# Patient Record
Sex: Female | Born: 1990 | Race: Black or African American | Hispanic: No | Marital: Single | Smoking: Former smoker
Health system: Southern US, Community
[De-identification: ages and names within clinical notes are randomized; demographics above are authoritative.]

## PROBLEM LIST (undated history)

## (undated) DIAGNOSIS — F509 Eating disorder, unspecified: Secondary | ICD-10-CM

## (undated) DIAGNOSIS — F329 Major depressive disorder, single episode, unspecified: Secondary | ICD-10-CM

## (undated) DIAGNOSIS — F32A Depression, unspecified: Secondary | ICD-10-CM

## (undated) DIAGNOSIS — R55 Syncope and collapse: Secondary | ICD-10-CM

## (undated) HISTORY — DX: Eating disorder, unspecified: F50.9

## (undated) HISTORY — DX: Depression, unspecified: F32.A

## (undated) HISTORY — DX: Major depressive disorder, single episode, unspecified: F32.9

## (undated) HISTORY — DX: Syncope and collapse: R55

---
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
## 2010-03-12 ENCOUNTER — Ambulatory Visit
Admission: RE | Admit: 2010-03-12 | Discharge: 2010-03-12 | Payer: Self-pay | Source: Home / Self Care | Admitting: Otolaryngology

## 2010-11-17 LAB — POCT HEMOGLOBIN-HEMACUE
Hemoglobin: 10 g/dL — ABNORMAL LOW (ref 12.0–15.0)
Hemoglobin: 9.3 g/dL — ABNORMAL LOW (ref 12.0–15.0)
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
## 2011-07-20 ENCOUNTER — Emergency Department: Payer: Self-pay | Admitting: Internal Medicine

## 2011-08-06 ENCOUNTER — Emergency Department: Payer: Self-pay

## 2012-11-11 ENCOUNTER — Emergency Department: Payer: Self-pay | Admitting: Emergency Medicine

## 2012-11-12 LAB — COMPREHENSIVE METABOLIC PANEL
Albumin: 3.6 g/dL (ref 3.4–5.0)
Alkaline Phosphatase: 41 U/L — ABNORMAL LOW (ref 50–136)
Anion Gap: 8 (ref 7–16)
BUN: 9 mg/dL (ref 7–18)
Bilirubin,Total: 0.5 mg/dL (ref 0.2–1.0)
Calcium, Total: 8.7 mg/dL (ref 8.5–10.1)
Chloride: 106 mmol/L (ref 98–107)
Co2: 21 mmol/L (ref 21–32)
Creatinine: 0.57 mg/dL — ABNORMAL LOW (ref 0.60–1.30)
EGFR (African American): >60
EGFR (Non-African Amer.): >60
Glucose: 77 mg/dL (ref 65–99)
Osmolality: 268 (ref 275–301)
Potassium: 3.8 mmol/L (ref 3.5–5.1)
SGOT(AST): 21 U/L (ref 15–37)
SGPT (ALT): 18 U/L (ref 12–78)
Sodium: 135 mmol/L — ABNORMAL LOW (ref 136–145)
Total Protein: 7.3 g/dL (ref 6.4–8.2)

## 2012-11-12 LAB — URINALYSIS, COMPLETE
Bilirubin,UR: NEGATIVE
Glucose,UR: NEGATIVE mg/dL (ref 0–75)
Ketone: NEGATIVE
Leukocyte Esterase: NEGATIVE
Nitrite: POSITIVE
Ph: 7 (ref 4.5–8.0)
Protein: NEGATIVE
RBC,UR: 78 /HPF (ref 0–5)
Specific Gravity: 1.019 (ref 1.003–1.030)
Squamous Epithelial: <1
WBC UR: 1 /HPF (ref 0–5)

## 2012-11-12 LAB — CBC WITH DIFFERENTIAL/PLATELET
Basophil #: 0 10*3/uL (ref 0.0–0.1)
Basophil %: 0.7 %
Eosinophil #: 0.1 10*3/uL (ref 0.0–0.7)
Eosinophil %: 1.1 %
HCT: 26 % — ABNORMAL LOW (ref 35.0–47.0)
HGB: 7.7 g/dL — ABNORMAL LOW (ref 12.0–16.0)
Lymphocyte #: 2.5 10*3/uL (ref 1.0–3.6)
Lymphocyte %: 46 %
MCH: 23.2 pg — ABNORMAL LOW (ref 26.0–34.0)
MCHC: 29.6 g/dL — ABNORMAL LOW (ref 32.0–36.0)
MCV: 79 fL — ABNORMAL LOW (ref 80–100)
Monocyte #: 0.6 x10 3/mm (ref 0.2–0.9)
Monocyte %: 10.4 %
Neutrophil #: 2.3 10*3/uL (ref 1.4–6.5)
Neutrophil %: 41.8 %
Platelet: 263 10*3/uL (ref 150–440)
RBC: 3.31 10*6/uL — ABNORMAL LOW (ref 3.80–5.20)
RDW: 20 % — ABNORMAL HIGH (ref 11.5–14.5)
WBC: 5.4 10*3/uL (ref 3.6–11.0)

## 2012-11-12 LAB — PREGNANCY, URINE: Pregnancy Test, Urine: NEGATIVE m[IU]/mL

## 2012-11-12 LAB — CK: CK, Total: 177 U/L (ref 21–215)

## 2013-01-12 ENCOUNTER — Emergency Department: Payer: Self-pay | Admitting: Internal Medicine

## 2013-01-12 LAB — COMPREHENSIVE METABOLIC PANEL
Albumin: 4.2 g/dL (ref 3.4–5.0)
Alkaline Phosphatase: 39 U/L — ABNORMAL LOW (ref 50–136)
Anion Gap: 10 (ref 7–16)
BUN: 14 mg/dL (ref 7–18)
Bilirubin,Total: 0.9 mg/dL (ref 0.2–1.0)
Calcium, Total: 9.2 mg/dL (ref 8.5–10.1)
Chloride: 106 mmol/L (ref 98–107)
Co2: 21 mmol/L (ref 21–32)
Creatinine: 0.59 mg/dL — ABNORMAL LOW (ref 0.60–1.30)
EGFR (African American): >60
EGFR (Non-African Amer.): >60
Glucose: 103 mg/dL — ABNORMAL HIGH (ref 65–99)
Osmolality: 275 (ref 275–301)
Potassium: 3.5 mmol/L (ref 3.5–5.1)
SGOT(AST): 26 U/L (ref 15–37)
SGPT (ALT): 19 U/L (ref 12–78)
Sodium: 137 mmol/L (ref 136–145)
Total Protein: 8 g/dL (ref 6.4–8.2)

## 2013-01-12 LAB — CBC
HCT: 28 % — ABNORMAL LOW (ref 35.0–47.0)
HGB: 8.6 g/dL — ABNORMAL LOW (ref 12.0–16.0)
MCH: 23.3 pg — ABNORMAL LOW (ref 26.0–34.0)
MCHC: 30.7 g/dL — ABNORMAL LOW (ref 32.0–36.0)
MCV: 76 fL — ABNORMAL LOW (ref 80–100)
Platelet: 376 10*3/uL (ref 150–440)
RBC: 3.69 10*6/uL — ABNORMAL LOW (ref 3.80–5.20)
RDW: 21.5 % — ABNORMAL HIGH (ref 11.5–14.5)
WBC: 4.2 10*3/uL (ref 3.6–11.0)

## 2013-01-12 LAB — URINALYSIS, COMPLETE
Bilirubin,UR: NEGATIVE
Glucose,UR: NEGATIVE mg/dL (ref 0–75)
Leukocyte Esterase: NEGATIVE
Nitrite: NEGATIVE
Ph: 5 (ref 4.5–8.0)
Protein: 30
RBC,UR: 323 /HPF (ref 0–5)
Specific Gravity: 1.03 (ref 1.003–1.030)
Squamous Epithelial: 3
WBC UR: 3 /HPF (ref 0–5)

## 2013-01-12 LAB — FERRITIN: Ferritin (ARMC): 4 ng/mL — ABNORMAL LOW (ref 8–388)

## 2013-01-12 LAB — LACTATE DEHYDROGENASE: LDH: 158 U/L (ref 81–246)

## 2013-01-12 LAB — IRON: Iron: 24 ug/dL — ABNORMAL LOW (ref 50–170)

## 2013-01-12 LAB — RETICULOCYTES
Absolute Retic Count: 0.0558 10*6/uL
Reticulocyte: 1.52 %

## 2013-01-17 LAB — PROT IMMUNOELECTROPHORES(ARMC)

## 2013-02-15 ENCOUNTER — Emergency Department: Payer: Self-pay | Admitting: Emergency Medicine

## 2013-02-15 LAB — BASIC METABOLIC PANEL
Anion Gap: 6 — ABNORMAL LOW (ref 7–16)
BUN: 10 mg/dL (ref 7–18)
Calcium, Total: 9.6 mg/dL (ref 8.5–10.1)
Chloride: 105 mmol/L (ref 98–107)
Co2: 24 mmol/L (ref 21–32)
Creatinine: 0.72 mg/dL (ref 0.60–1.30)
EGFR (African American): >60
EGFR (Non-African Amer.): >60
Glucose: 79 mg/dL (ref 65–99)
Osmolality: 268 (ref 275–301)
Potassium: 4 mmol/L (ref 3.5–5.1)
Sodium: 135 mmol/L — ABNORMAL LOW (ref 136–145)

## 2013-02-15 LAB — URINALYSIS, COMPLETE
Bilirubin,UR: NEGATIVE
Glucose,UR: NEGATIVE mg/dL (ref 0–75)
Hyaline Cast: 5
Ketone: NEGATIVE
Nitrite: NEGATIVE
Ph: 6 (ref 4.5–8.0)
Protein: >=500
RBC,UR: 399 /HPF (ref 0–5)
Specific Gravity: 1.017 (ref 1.003–1.030)
Squamous Epithelial: 6
WBC UR: 888 /HPF (ref 0–5)

## 2013-02-15 LAB — CBC
HCT: 29.7 % — ABNORMAL LOW (ref 35.0–47.0)
HGB: 9.3 g/dL — ABNORMAL LOW (ref 12.0–16.0)
MCH: 24.3 pg — ABNORMAL LOW (ref 26.0–34.0)
MCHC: 31.2 g/dL — ABNORMAL LOW (ref 32.0–36.0)
MCV: 78 fL — ABNORMAL LOW (ref 80–100)
Platelet: 298 10*3/uL (ref 150–440)
RBC: 3.81 10*6/uL (ref 3.80–5.20)
RDW: 22.3 % — ABNORMAL HIGH (ref 11.5–14.5)
WBC: 5.6 10*3/uL (ref 3.6–11.0)

## 2013-09-14 ENCOUNTER — Ambulatory Visit: Payer: Self-pay | Admitting: Family Medicine

## 2013-10-05 ENCOUNTER — Encounter: Payer: Self-pay | Admitting: Family Medicine

## 2013-10-05 ENCOUNTER — Ambulatory Visit (INDEPENDENT_AMBULATORY_CARE_PROVIDER_SITE_OTHER): Payer: BC Managed Care – PPO | Admitting: Family Medicine

## 2013-10-05 VITALS — BP 88/50 | HR 84 | Temp 98.3°F | Ht 65.5 in | Wt 106.0 lb

## 2013-10-05 DIAGNOSIS — Z8659 Personal history of other mental and behavioral disorders: Secondary | ICD-10-CM | POA: Insufficient documentation

## 2013-10-05 DIAGNOSIS — L708 Other acne: Secondary | ICD-10-CM

## 2013-10-05 DIAGNOSIS — F909 Attention-deficit hyperactivity disorder, unspecified type: Secondary | ICD-10-CM

## 2013-10-05 DIAGNOSIS — F1210 Cannabis abuse, uncomplicated: Secondary | ICD-10-CM

## 2013-10-05 DIAGNOSIS — L709 Acne, unspecified: Secondary | ICD-10-CM | POA: Insufficient documentation

## 2013-10-05 DIAGNOSIS — D649 Anemia, unspecified: Secondary | ICD-10-CM | POA: Insufficient documentation

## 2013-10-05 DIAGNOSIS — R634 Abnormal weight loss: Secondary | ICD-10-CM | POA: Insufficient documentation

## 2013-10-05 DIAGNOSIS — F17200 Nicotine dependence, unspecified, uncomplicated: Secondary | ICD-10-CM | POA: Insufficient documentation

## 2013-10-05 DIAGNOSIS — F1290 Cannabis use, unspecified, uncomplicated: Secondary | ICD-10-CM | POA: Insufficient documentation

## 2013-10-05 DIAGNOSIS — Z0000 Encounter for general adult medical examination without abnormal findings: Secondary | ICD-10-CM | POA: Insufficient documentation

## 2013-10-05 MED ORDER — CLINDAMYCIN PHOS-BENZOYL PEROX 1-5 % EX GEL: Freq: Two times a day (BID) | CUTANEOUS | Status: DC

## 2013-10-05 NOTE — Progress Notes (Signed)
Subjective:    Patient ID: Patricia Allison, female    DOB: 09-28-1990, 23 y.o.   MRN: [id number]  HPI Here to get established  Saw Dr Hyacinth Meeker - in the past pediatrics  Thinks she is up to date on all of her shots- last visit was before she turned 38   She went to [facility] once and then she did not like the visit so she left  She has never had a pap smear   She has recently lost wt  Generally wants to weight - usually 115 (lost down to 106) Thinks she has an "eating disorder" - gets very very hungry -- and then eats 2 bites and feels full and then throws it away (pt listed eating disorder to her intake form  When she smokes marijuana - her appetite is much better and she can eat large amounts of food  Never had anorexia / bulemia  Currently she thinks her weight is "just right" - but more comfortable at 115 lb  Mother thinks she is underweight   In the past - she struggled with depression  Had a time in her life when she did not get along with parents - was about 55 years old - went to [facility] for a week stay  No hx of a suicide attempt  Never had psych care after that  Does not feel depressed at all now   Hx of ADHD  Was on adderall for a while   She has never seen her doctor about weight before  People in family tend to be slim  Lives with her parents and a younger brother (53 years old) - and has one brother in college  They all get along pretty well    Works at [employer] and [employer] - she works 40 plus hours per week  It is only her 3 rd week   Having some issues with back pain - this stared years ago  Has been anemic for a long time- and is cold intolerant - this makes her back hurt  Back just hurts when she gets cold - lower back - which radiates to her knees  Sharp pain that shoots down  Right in the middle  Never had her back xrayed  Her new job - she works in [employer]    No idea why she is anemic Has had to take iron - otc : she  takes one a day - does not know how many mg it is   No sickle cell that she knows of , a great aunt had it , and no trait in her parents  Her periods are spotty/ light - sometimes she has cramps and sometimes not   Smokes 3 cig per day  Smokes marijuana twice daily (does smoke it )  Occasional alcohol   Also acne  Needs refill of benzaclin -worked well from Dr Hyacinth Meeker    There are no active problems to display for this patient.  Past Medical History  Diagnosis Date  . Depression   . Eating disorder   . Fainting spell    History reviewed. No pertinent past surgical history. History  Substance Use Topics  . Smoking status: Current Every Day Smoker -- 0.30 packs/day    Types: Cigarettes  . Smokeless tobacco: Never Used  . Alcohol Use: Yes     Comment: occ   Family History  Problem Relation Age of Onset  . Alcohol abuse  Maternal Uncle    No Known Allergies No current outpatient prescriptions on file prior to visit.   No current facility-administered medications on file prior to visit.    Review of Systems Review of Systems  Constitutional: Negative for fever, appetite change, and unexpected weight change.pos for fatigue  pos for decreased appetite  Eyes: Negative for pain and visual disturbance.  Respiratory: Negative for cough and shortness of breath.   Cardiovascular: Negative for cp or palpitations    Gastrointestinal: Negative for nausea, diarrhea and constipation. pos for early satiety Genitourinary: Negative for urgency and frequency. neg for vaginal d/c or pain Endo: pos for extreme cold intolerance MSK pos for low back ache when she gets cold  Skin: Negative for pallor or rash   Neurological: Negative for weakness, light-headedness, numbness and headaches.  Hematological: Negative for adenopathy. Does not bruise/bleed easily.  Psychiatric/Behavioral: Negative for dysphoric mood. The patient is not nervous/anxious.  pos for problems paying attention/ adhd         Objective:   Physical Exam  Constitutional: She appears well-developed and well-nourished. No distress.  underwt and well appearing   HENT:  Head: Normocephalic and atraumatic.  Right Ear: External ear normal.  Left Ear: External ear normal.  Nose: Nose normal.  Mouth/Throat: Oropharynx is clear and moist.  Eyes: Conjunctivae and EOM are normal. Pupils are equal, round, and reactive to light. Right eye exhibits no discharge. Left eye exhibits no discharge. No scleral icterus.  Neck: Normal range of motion. Neck supple. No JVD present. Thyromegaly present.  Symmetric thyromegally-nt and no thyroid bruits  Pt has long slim neck which could make thyroid appear larger as well   Cardiovascular: Normal rate, regular rhythm, normal heart sounds and intact distal pulses.  Exam reveals no gallop.   Pulmonary/Chest: Effort normal and breath sounds normal. No respiratory distress. She has no wheezes. She has no rales.  No wheeze   Abdominal: Soft. Bowel sounds are normal. She exhibits no distension and no mass. There is no tenderness.  Musculoskeletal: She exhibits no edema and no tenderness.  Lymphadenopathy:    She has no cervical adenopathy.  Neurological: She is alert. She has normal reflexes. She displays no tremor. No cranial nerve deficit. She exhibits normal muscle tone. Coordination normal.  Skin: Skin is warm and dry. No rash noted. No erythema. No pallor.  Mild comedone acne chin and forehead  Psychiatric: She has a normal mood and affect.  Not seemingly anxious or depressed- but affect is somewhat odd  She changes subjects often and her attentiveness is generally poor           Assessment & Plan:

## 2013-10-05 NOTE — Patient Instructions (Addendum)
Please send for last 3 notes/ last labs and immunization records from Dr Hyacinth MeekerMiller (pediatrics) Please sign a release so I can talk to your mother on the phone if I need to  Labs today  Here is a px for benzaclin for acne- I sent it to the pharmacy Work on quitting smoking - we can discuss birth control when you follow up  For back pain instead of tylenol try 2 advil (ibuprofen) with food up to every 6 hours - this should work a lot better  Follow up with me in the next month for physical/ gyn exam (when you are not on your period) - any 30 minute visit

## 2013-10-05 NOTE — Progress Notes (Signed)
Pre-visit discussion using our clinic review tool. No additional management support is needed unless otherwise documented below in the visit note.  

## 2013-10-06 LAB — COMPREHENSIVE METABOLIC PANEL
ALT: 11 U/L (ref 0–35)
AST: 24 U/L (ref 0–37)
Albumin: 4.1 g/dL (ref 3.5–5.2)
Alkaline Phosphatase: 29 U/L — ABNORMAL LOW (ref 39–117)
BUN: 11 mg/dL (ref 6–23)
CO2: 24 mEq/L (ref 19–32)
Calcium: 9.3 mg/dL (ref 8.4–10.5)
Chloride: 106 mEq/L (ref 96–112)
Creatinine, Ser: 0.6 mg/dL (ref 0.4–1.2)
GFR: 153.86 mL/min (ref >60.00)
Glucose, Bld: 82 mg/dL (ref 70–99)
Potassium: 4 mEq/L (ref 3.5–5.1)
Sodium: 137 mEq/L (ref 135–145)
Total Bilirubin: 0.9 mg/dL (ref 0.3–1.2)
Total Protein: 7.6 g/dL (ref 6.0–8.3)

## 2013-10-06 LAB — CBC WITH DIFFERENTIAL/PLATELET
Basophils Absolute: 0 10*3/uL (ref 0.0–0.1)
Basophils Relative: 0.4 % (ref 0.0–3.0)
Eosinophils Absolute: 0.1 10*3/uL (ref 0.0–0.7)
Eosinophils Relative: 1.1 % (ref 0.0–5.0)
HCT: 26.6 % — ABNORMAL LOW (ref 36.0–46.0)
Hemoglobin: 8.4 g/dL — ABNORMAL LOW (ref 12.0–15.0)
Lymphocytes Relative: 47.8 % — ABNORMAL HIGH (ref 12.0–46.0)
Lymphs Abs: 2.5 10*3/uL (ref 0.7–4.0)
MCHC: 31.7 g/dL (ref 30.0–36.0)
MCV: 76.9 fl — ABNORMAL LOW (ref 78.0–100.0)
Monocytes Absolute: 0.3 10*3/uL (ref 0.1–1.0)
Monocytes Relative: 5.9 % (ref 3.0–12.0)
Neutro Abs: 2.3 10*3/uL (ref 1.4–7.7)
Neutrophils Relative %: 44.8 % (ref 43.0–77.0)
Platelets: 296 10*3/uL (ref 150.0–400.0)
RBC: 3.46 Mil/uL — ABNORMAL LOW (ref 3.87–5.11)
RDW: 22.1 % — ABNORMAL HIGH (ref 11.5–14.6)
WBC: 5.2 10*3/uL (ref 4.5–10.5)

## 2013-10-06 LAB — TSH: TSH: 0.22 u[IU]/mL — ABNORMAL LOW (ref 0.35–5.50)

## 2013-10-06 LAB — LIPID PANEL
Cholesterol: 171 mg/dL (ref 0–200)
HDL: 72.4 mg/dL (ref >39.00)
LDL Cholesterol: 89 mg/dL (ref 0–99)
Total CHOL/HDL Ratio: 2
Triglycerides: 48 mg/dL (ref 0.0–149.0)
VLDL: 9.6 mg/dL (ref 0.0–40.0)

## 2013-10-06 LAB — FERRITIN: Ferritin: 4.3 ng/mL — ABNORMAL LOW (ref 10.0–291.0)

## 2013-10-07 ENCOUNTER — Ambulatory Visit: Payer: Self-pay | Admitting: Family Medicine

## 2013-10-07 ENCOUNTER — Ambulatory Visit: Payer: BC Managed Care – PPO

## 2013-10-07 DIAGNOSIS — R946 Abnormal results of thyroid function studies: Secondary | ICD-10-CM

## 2013-10-07 LAB — T4, FREE: Free T4: 1.03 ng/dL (ref 0.60–1.60)

## 2013-10-09 ENCOUNTER — Emergency Department: Payer: Self-pay | Admitting: Emergency Medicine

## 2013-10-09 NOTE — Assessment & Plan Note (Signed)
Rev her drug use which undoubtedly helps mood and also ADHD Disc risks of this - along with issues it creates with functionality/ motivation in workplace etc Adv to stop

## 2013-10-09 NOTE — Assessment & Plan Note (Signed)
Appetite/ satiety issues Per pt marijuana helps She denies dep/ anx but I do suspect mood disorder Pt is unclear re: hx of [facility] tx for dep and also ? Eating disorder Lab today May benefit from psychiatric treatment in the future  Will need to watch this carefully

## 2013-10-09 NOTE — Assessment & Plan Note (Signed)
Disc in detail risks of smoking and possible outcomes including copd, vascular/ heart disease, cancer , respiratory and sinus infections  Pt voices understanding In addition -pt is aware we cannot px OC in the future w/o smoking cessation due to risk of DVT Also recommend cessation of marijuana smoking for toxic effects as well

## 2013-10-09 NOTE — Assessment & Plan Note (Signed)
No meds currently  Pt does not think she needs any  She self treats with marijuana -which I do not advise

## 2013-10-09 NOTE — Assessment & Plan Note (Signed)
Px benzaclin which she has used before  Also disc hygiene/ cleansing products and regime

## 2013-10-09 NOTE — Assessment & Plan Note (Signed)
Per pt-she has no idea where her anemia comes from - but is lifelong and likely iron def She does not know her current iron dose C/o fatigue and cold intol Lab today Did get permission to talk to her mother re: hx of anemia to get more info (did also send for records from peds) She denies [place] in family Also no GI symptoms

## 2013-10-09 NOTE — Assessment & Plan Note (Signed)
Per pt -unclear hx of hosp briefly-but no meds She self treats with marijuana which I do not recommend  She denies sympt now - but has issues with appetite that I suspect are linked

## 2013-10-09 NOTE — Assessment & Plan Note (Signed)
Labs today for f/u PE planned

## 2013-10-10 ENCOUNTER — Encounter: Payer: Self-pay | Admitting: *Deleted

## 2013-10-17 ENCOUNTER — Encounter: Payer: Self-pay | Admitting: *Deleted

## 2013-10-17 ENCOUNTER — Encounter: Payer: Self-pay | Admitting: Family Medicine

## 2013-10-17 ENCOUNTER — Ambulatory Visit (INDEPENDENT_AMBULATORY_CARE_PROVIDER_SITE_OTHER): Payer: BC Managed Care – PPO | Admitting: Family Medicine

## 2013-10-17 VITALS — BP 104/72 | HR 76 | Temp 97.9°F | Ht 65.5 in | Wt 108.5 lb

## 2013-10-17 DIAGNOSIS — E049 Nontoxic goiter, unspecified: Secondary | ICD-10-CM

## 2013-10-17 DIAGNOSIS — F909 Attention-deficit hyperactivity disorder, unspecified type: Secondary | ICD-10-CM

## 2013-10-17 DIAGNOSIS — D649 Anemia, unspecified: Secondary | ICD-10-CM

## 2013-10-17 DIAGNOSIS — F17200 Nicotine dependence, unspecified, uncomplicated: Secondary | ICD-10-CM

## 2013-10-17 LAB — IBC PANEL
Iron: 35 ug/dL — ABNORMAL LOW (ref 42–145)
Saturation Ratios: 6.8 % — ABNORMAL LOW (ref 20.0–50.0)
Transferrin: 366.6 mg/dL — ABNORMAL HIGH (ref 212.0–360.0)

## 2013-10-17 LAB — TSH: TSH: 0.34 u[IU]/mL — ABNORMAL LOW (ref 0.35–5.50)

## 2013-10-17 LAB — T3, FREE: T3, Free: 2.7 pg/mL (ref 2.3–4.2)

## 2013-10-17 LAB — T4, FREE: Free T4: 0.94 ng/dL (ref 0.60–1.60)

## 2013-10-17 NOTE — Patient Instructions (Signed)
Labs for anemia today including iron studies and sickle cell test  Also more thyroid labs Also we will refer you for an ultrasound of the thyroid to assess for possible enlarged thyroid (goiter)  Try to eat regular meals the best you can Keep working on quitting smoking

## 2013-10-17 NOTE — Progress Notes (Signed)
Subjective:    Patient ID: Patricia Allison, female    DOB: 07/29/1991, 23 y.o.   MRN: [id number]  HPI Here for f/u of anemia and other problems  She is having a lot of chills/ feels cold - and then she gets pain all over  Her mother mentioned to me that there is sickle cell in the family   Now taking iron otc 65 mg twice daily -no side eff  She does not think her periods are very heavy Lasts 3-5 days / uses about 5 pads in a day - and occ passes small clots  If any cramps- just one day  No hx of bleeding ulcer  No dark stool or blood in stool   No chance pregnant right now   Is still smoking marijuana- not ready to quit that -it is relaxing to her  Is also helping her eat   Helps her ADHD  Mother said she did better with med in the past and pt disagreed (she thinks it made her chest hurt)- stopped it at [age] years old    tsh low - and also loosing wt / no appetite  Has a goiter-this has never worked up  No shakiness Wants to sleep all the time Has lost significant wt   Wt is up 2 lb today -- wearing more clothes (per pt)  She is still smoking 2-3 cig per day- takes a few puffs and then breaks the cigarette - trying to quit    Patient Active Problem List   Diagnosis Date Noted  . Goiter 10/17/2013  . Anemia 10/05/2013  . ADHD (attention deficit hyperactivity disorder) 10/05/2013  . Acne 10/05/2013  . History of depression 10/05/2013  . Loss of weight 10/05/2013  . Routine general medical examination at a health care facility 10/05/2013  . Smoker 10/05/2013  . Marijuana use 10/05/2013   Past Medical History  Diagnosis Date  . Depression   . Eating disorder   . Fainting spell    No past surgical history on file. History  Substance Use Topics  . Smoking status: Current Every Day Smoker -- 0.30 packs/day    Types: Cigarettes  . Smokeless tobacco: Never Used  . Alcohol Use: Yes     Comment: occ   Family History  Problem Relation Age of Onset  . Alcohol  abuse Maternal Uncle    No Known Allergies Current Outpatient Prescriptions on File Prior to Visit  Medication Sig Dispense Refill  . clindamycin-benzoyl peroxide (BENZACLIN) gel Apply topically 2 (two) times daily. To affected areas for acne  50 g  1  . FERROUS SULFATE PO Take 65 mg by mouth 2 (two) times daily.        No current facility-administered medications on file prior to visit.    Review of Systems Review of Systems  Constitutional: Negative for fever, appetite change,  and unexpected weight change. pos for fatigue  Eyes: Negative for pain and visual disturbance.  Respiratory: Negative for cough and shortness of breath.  neg for wheezing  Cardiovascular: Negative for cp or palpitations    Gastrointestinal: Negative for nausea, diarrhea and constipation.  Genitourinary: Negative for urgency and frequency.  Skin: Negative for pallor or rash   Neurological: Negative for weakness, light-headedness, numbness and headaches. neg for tremor  Hematological: Negative for adenopathy. Does not bruise/bleed easily.  Psychiatric/Behavioral: Negative for dysphoric mood. Pos for adhd/ trouble concentrating / pt denies anxiety        Objective:  Physical Exam  Constitutional: She appears well-developed and well-nourished. No distress.  Underweight and well appearing   HENT:  Head: Normocephalic and atraumatic.  Mouth/Throat: Oropharynx is clear and moist.  Eyes: EOM are normal. Pupils are equal, round, and reactive to light. Right eye exhibits no discharge. Left eye exhibits no discharge. No scleral icterus.  Mild conj pallor   Neck: Normal range of motion. Neck supple. No JVD present. No tracheal deviation present. Thyromegaly present.  Cardiovascular: Normal rate, regular rhythm and intact distal pulses.  Exam reveals no gallop.   Pulmonary/Chest: Effort normal and breath sounds normal. No respiratory distress. She has no wheezes. She has no rales.  Abdominal: Soft. Bowel sounds are  normal. She exhibits no distension and no mass. There is no tenderness. There is no rebound and no guarding.  No suprapubic tenderness or fullness    Musculoskeletal: She exhibits no edema and no tenderness.  Lymphadenopathy:    She has no cervical adenopathy.  Neurological: She is alert. She has normal reflexes. No cranial nerve deficit. She exhibits normal muscle tone. Coordination normal.  Fine tremor   Skin: Skin is warm and dry. No rash noted. No erythema. No pallor.  Skin is somewhat dry   Psychiatric: She has a normal mood and affect.          Assessment & Plan:

## 2013-10-17 NOTE — Progress Notes (Signed)
Pre-visit discussion using our clinic review tool. No additional management support is needed unless otherwise documented below in the visit note.  

## 2013-10-18 LAB — HEMOGLOBINOPATHY EVALUATION
Hemoglobin Other: 0 %
Hgb A2 Quant: 2.2 % (ref 2.2–3.2)
Hgb A: 97.8 % (ref 96.8–97.8)
Hgb F Quant: 0 % (ref 0.0–2.0)
Hgb S Quant: 0 %

## 2013-10-18 LAB — T3 UPTAKE: T3 Uptake: 35.9 % (ref 22.5–37.0)

## 2013-10-18 NOTE — Assessment & Plan Note (Signed)
Enc pt to quit smoking  She is aware that being on OC is risky (blood clot wise) if she continues  Also adv to stop marijuana use

## 2013-10-18 NOTE — Assessment & Plan Note (Signed)
Per pt chronic - ? Source  Per mother -there is sickle cell in family  Moderate but not heavy periods Appears to be iron def with low ferritin  Will continue otc iron bid  Check IBC panel and HB electrophoresis to check for sickle cell/ trait

## 2013-10-18 NOTE — Assessment & Plan Note (Signed)
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

## 2013-10-18 NOTE — Assessment & Plan Note (Signed)
With low tsh first check  Draw thyroid profile Low appetite and wt loss  Schedule thyroid ultrasound

## 2013-10-19 ENCOUNTER — Ambulatory Visit: Payer: BC Managed Care – PPO | Admitting: Family Medicine

## 2013-10-19 ENCOUNTER — Telehealth: Payer: Self-pay | Admitting: Family Medicine

## 2013-10-19 NOTE — Telephone Encounter (Signed)
Relevant patient education mailed to patient.  

## 2013-10-21 ENCOUNTER — Ambulatory Visit: Payer: Self-pay | Admitting: Family Medicine

## 2013-10-21 ENCOUNTER — Other Ambulatory Visit: Payer: Self-pay | Admitting: *Deleted

## 2013-10-21 ENCOUNTER — Encounter: Payer: Self-pay | Admitting: Family Medicine

## 2013-10-21 ENCOUNTER — Encounter: Payer: Self-pay | Admitting: *Deleted

## 2013-10-21 MED ORDER — POLYSACCHARIDE IRON COMPLEX 150 MG PO CAPS: 150.0000 mg | ORAL_CAPSULE | Freq: Two times a day (BID) | ORAL | Status: DC

## 2013-10-21 NOTE — Telephone Encounter (Signed)
(  Per Dr. Willeen Cassower)due to lab results Dr. Milinda Antisower wants her to start taking Rx strength iron

## 2013-10-26 ENCOUNTER — Encounter: Payer: Self-pay | Admitting: *Deleted

## 2013-11-02 ENCOUNTER — Encounter: Payer: BC Managed Care – PPO | Admitting: Family Medicine

## 2013-11-09 ENCOUNTER — Other Ambulatory Visit: Payer: Self-pay | Admitting: Family Medicine

## 2013-11-09 DIAGNOSIS — D649 Anemia, unspecified: Secondary | ICD-10-CM

## 2013-11-16 ENCOUNTER — Other Ambulatory Visit (INDEPENDENT_AMBULATORY_CARE_PROVIDER_SITE_OTHER): Payer: BC Managed Care – PPO

## 2013-11-16 DIAGNOSIS — Z0000 Encounter for general adult medical examination without abnormal findings: Secondary | ICD-10-CM

## 2013-11-16 DIAGNOSIS — D649 Anemia, unspecified: Secondary | ICD-10-CM

## 2013-11-16 LAB — FERRITIN: Ferritin: 7 ng/mL — ABNORMAL LOW (ref 10.0–291.0)

## 2013-11-16 LAB — CBC WITH DIFFERENTIAL/PLATELET
Basophils Absolute: 0.1 10*3/uL (ref 0.0–0.1)
Basophils Relative: 1.1 % (ref 0.0–3.0)
Eosinophils Absolute: 0.1 10*3/uL (ref 0.0–0.7)
Eosinophils Relative: 3.1 % (ref 0.0–5.0)
HCT: 33.5 % — ABNORMAL LOW (ref 36.0–46.0)
Hemoglobin: 10.6 g/dL — ABNORMAL LOW (ref 12.0–15.0)
Lymphocytes Relative: 53.5 % — ABNORMAL HIGH (ref 12.0–46.0)
Lymphs Abs: 2.5 10*3/uL (ref 0.7–4.0)
MCHC: 31.9 g/dL (ref 30.0–36.0)
MCV: 83.3 fl (ref 78.0–100.0)
Monocytes Absolute: 0.3 10*3/uL (ref 0.1–1.0)
Monocytes Relative: 7.1 % (ref 3.0–12.0)
Neutro Abs: 1.7 10*3/uL (ref 1.4–7.7)
Neutrophils Relative %: 35.2 % — ABNORMAL LOW (ref 43.0–77.0)
Platelets: 330 10*3/uL (ref 150.0–400.0)
RBC: 4.01 Mil/uL (ref 3.87–5.11)
RDW: 25.8 % — ABNORMAL HIGH (ref 11.5–14.6)
WBC: 4.7 10*3/uL (ref 4.5–10.5)

## 2013-11-23 ENCOUNTER — Encounter: Payer: BC Managed Care – PPO | Admitting: Family Medicine

## 2013-11-29 ENCOUNTER — Encounter: Payer: Self-pay | Admitting: Family Medicine

## 2013-11-29 ENCOUNTER — Encounter: Payer: BC Managed Care – PPO | Admitting: Family Medicine

## 2013-11-29 DIAGNOSIS — Z0289 Encounter for other administrative examinations: Secondary | ICD-10-CM

## 2013-12-01 ENCOUNTER — Telehealth: Payer: Self-pay | Admitting: Family Medicine

## 2013-12-01 NOTE — Telephone Encounter (Addendum)
Patient dismissed from [facility] by Roxy MannsMarne Tower MD , effective November 29, 2013. Dismissal letter sent out by certified / registered mail. DAJ  Received signed domestic return receipt verifying delivery of certified letter on December 06, 2013. Article number [phone] [id number] DAJ

## 2013-12-22 ENCOUNTER — Telehealth: Payer: Self-pay | Admitting: Family Medicine

## 2013-12-22 NOTE — Telephone Encounter (Signed)
Pt's mom is calling. She said that her daughter made her own appt and should not have because she is mildly-mentally retarded.  That's why she missed the appt bc her mom didn't know.  She wants a call from you [phone]

## 2015-03-22 ENCOUNTER — Encounter: Payer: Self-pay | Admitting: Emergency Medicine

## 2015-03-22 ENCOUNTER — Emergency Department
Admission: EM | Admit: 2015-03-22 | Discharge: 2015-03-22 | Disposition: A | Discharge status: Home / Self Care | Payer: BLUE CROSS/BLUE SHIELD | Attending: Emergency Medicine | Admitting: Emergency Medicine

## 2015-03-22 DIAGNOSIS — Y9289 Other specified places as the place of occurrence of the external cause: Secondary | ICD-10-CM | POA: Insufficient documentation

## 2015-03-22 DIAGNOSIS — M791 Myalgia, unspecified site: Secondary | ICD-10-CM

## 2015-03-22 DIAGNOSIS — Z79899 Other long term (current) drug therapy: Secondary | ICD-10-CM | POA: Diagnosis not present

## 2015-03-22 DIAGNOSIS — R110 Nausea: Secondary | ICD-10-CM | POA: Diagnosis not present

## 2015-03-22 DIAGNOSIS — Y9389 Activity, other specified: Secondary | ICD-10-CM | POA: Insufficient documentation

## 2015-03-22 DIAGNOSIS — T678XXA Other effects of heat and light, initial encounter: Secondary | ICD-10-CM | POA: Diagnosis not present

## 2015-03-22 DIAGNOSIS — Z720 Tobacco use: Secondary | ICD-10-CM | POA: Diagnosis not present

## 2015-03-22 DIAGNOSIS — Y998 Other external cause status: Secondary | ICD-10-CM | POA: Insufficient documentation

## 2015-03-22 DIAGNOSIS — T679XXA Effect of heat and light, unspecified, initial encounter: Secondary | ICD-10-CM

## 2015-03-22 DIAGNOSIS — X30XXXA Exposure to excessive natural heat, initial encounter: Secondary | ICD-10-CM | POA: Diagnosis not present

## 2015-03-22 LAB — CBC WITH DIFFERENTIAL/PLATELET
Basophils Absolute: 0 10*3/uL (ref 0–0.1)
Basophils Relative: 0 %
EOS PCT: 3 %
Eosinophils Absolute: 0.1 10*3/uL (ref 0–0.7)
HCT: 32.7 % — ABNORMAL LOW (ref 35.0–47.0)
Hemoglobin: 10.6 g/dL — ABNORMAL LOW (ref 12.0–16.0)
LYMPHS PCT: 42 %
Lymphs Abs: 2 10*3/uL (ref 1.0–3.6)
MCH: 28.4 pg (ref 26.0–34.0)
MCHC: 32.5 g/dL (ref 32.0–36.0)
MCV: 87.5 fL (ref 80.0–100.0)
MONOS PCT: 9 %
Monocytes Absolute: 0.4 10*3/uL (ref 0.2–0.9)
NEUTROS PCT: 46 %
Neutro Abs: 2.2 10*3/uL (ref 1.4–6.5)
Platelets: 287 10*3/uL (ref 150–440)
RBC: 3.74 MIL/uL — AB (ref 3.80–5.20)
RDW: 18.3 % — ABNORMAL HIGH (ref 11.5–14.5)
WBC: 4.7 10*3/uL (ref 3.6–11.0)

## 2015-03-22 LAB — BASIC METABOLIC PANEL
ANION GAP: 8 (ref 5–15)
BUN: 22 mg/dL — ABNORMAL HIGH (ref 6–20)
CALCIUM: 9 mg/dL (ref 8.9–10.3)
CO2: 22 mmol/L (ref 22–32)
Chloride: 107 mmol/L (ref 101–111)
Creatinine, Ser: 0.63 mg/dL (ref 0.44–1.00)
GFR calc Af Amer: >60 mL/min (ref >60)
GFR calc non Af Amer: >60 mL/min (ref >60)
Glucose, Bld: 88 mg/dL (ref 65–99)
POTASSIUM: 3.8 mmol/L (ref 3.5–5.1)
SODIUM: 137 mmol/L (ref 135–145)

## 2015-03-22 LAB — CK: Total CK: 285 U/L — ABNORMAL HIGH (ref 38–234)

## 2015-03-22 MED ORDER — SODIUM CHLORIDE 0.9 % IV BOLUS (SEPSIS)
1000.0000 mL | Freq: Once | INTRAVENOUS | Status: AC
  Administered 2015-03-22: 1000 mL via INTRAVENOUS
  Filled 2015-03-22: qty 1000

## 2015-03-22 MED ORDER — KETOROLAC TROMETHAMINE 30 MG/ML IJ SOLN
30.0000 mg | Freq: Once | INTRAMUSCULAR | Status: AC
  Administered 2015-03-22: 30 mg via INTRAVENOUS
  Filled 2015-03-22: qty 1

## 2015-03-22 NOTE — ED Notes (Signed)

## 2015-03-22 NOTE — Discharge Instructions (Signed)
1. Drink plenty of fluids daily and stay indoors. 2. You may take ibuprofen or Tylenol as needed for body aches. 3. Return to the ER for worsening symptoms, persistent vomiting, difficulty breathing or other concerns.  Heat-Related Illness Heat-related illnesses occur when the body is unable to properly cool itself. The body normally cools itself by sweating. However, under some conditions sweating is not enough. In these cases, a person's body temperature rises rapidly. Very high body temperatures may damage the brain or other vital organs. Some examples of heat-related illnesses include:  Heat stroke. This occurs when the body is unable to regulate its temperature. The body's temperature rises rapidly, the sweating mechanism fails, and the body is unable to cool down. Body temperature may rise to 106 F (41 C) or higher within 10 to 15 minutes. Heat stroke can cause death or permanent disability if emergency treatment is not provided.  Heat exhaustion. This is a milder form of heat-related illness that can develop after several days of exposure to high temperatures and not enough fluids. It is the body's response to an excessive loss of the water and salt contained in sweat.  Heat cramps. These usually affect people who sweat a lot during heavy activity. This sweating drains the body's salt and moisture. The low salt level in the muscles causes painful cramps. Heat cramps may also be a symptom of heat exhaustion. Heat cramps usually occur in the abdomen, arms, or legs. Get medical attention for cramps if you have heart problems or are on a low-sodium diet. Those that are at greatest risk for heat-related illnesses include:   The elderly.  Infant and the very young.  People with mental illness and chronic diseases.  People who are overweight (obese).  Young and healthy people can even succumb to heat if they participate in strenuous physical activities during hot weather. CAUSES  Several  factors affect the body's ability to cool itself during extremely hot weather. When the humidity is high, sweat will not evaporate as quickly. This prevents the body from releasing heat quickly. Other factors that can affect the body's ability to cool down include:   Age.  Obesity.  Fever.  Dehydration.  Heart disease.  Mental illness.  Poor circulation.  Sunburn.  Prescription drug use.  Alcohol use. SYMPTOMS  Heat stroke: Warning signs of heat stroke vary, but may include:  An extremely high body temperature (above 103F orally).  A fast, strong pulse.  Dizziness.  Confusion.  Red, hot, and dry skin.  No sweating.  Throbbing headache.  Feeling sick to your stomach (nauseous).  Unconsciousness. Heat exhaustion: Warning signs of heat exhaustion include:  Heavy sweating.  Tiredness.  Headache.  Paleness.  Weakness.  Feeling sick to your stomach (nauseous) or vomiting.  Muscle cramps. Heat cramps  Muscle pains or spasms. TREATMENT  Heat stroke  Get into a cool environment. An indoor place that is air-conditioned may be best.  Take a cool shower or bath. Have someone around to make sure you are okay.  Take your temperature. Make sure it is going down. Heat exhaustion  Drink plenty of fluids. Do not drink liquids that contain caffeine, alcohol, or large amounts of sugar. These cause you to lose more body fluid. Also, avoid very cold drinks. They can cause stomach cramps.  Get into a cool environment. An indoor place that is air-conditioned may be best.  Take a cool shower or bath. Have someone around to make sure you are okay.  Put on  lightweight clothing. Heat cramps  Stop whatever activity you were doing. Do not attempt to do that activity for at least 3 hours after the cramps have gone away.  Get into a cool environment. An indoor place that is air-conditioned may be best. HOME CARE INSTRUCTIONS  To protect your health when temperatures  are extremely high, follow these tips:  During heavy exercise in a hot environment, drink two to four glasses (16-32 ounces) of cool fluids each hour. Do not wait until you are thirsty to drink. Warning: If your caregiver limits the amount of fluid you drink or has you on water pills, ask how much you should drink while the weather is hot.  Do not drink liquids that contain caffeine, alcohol, or large amounts of sugar. These cause you to lose more body fluid.  Avoid very cold drinks. They can cause stomach cramps.  Wear appropriate clothing. Choose lightweight, light-colored, loose-fitting clothing.  If you must be outdoors, try to limit your outdoor activity to morning and evening hours. Try to rest often in shady areas.  If you are not used to working or exercising in a hot environment, start slowly and pick up the pace gradually.  Stay cool in an air-conditioned place if possible. If your home does not have air conditioning, go to the shopping mall or [employer].  Taking a cool shower or bath may help you cool off. SEEK MEDICAL CARE IF:   You see any of the symptoms listed above. You may be dealing with a life-threatening emergency.  Symptoms worsen or last longer than 1 hour.  Heat cramps do not get better in 1 hour. MAKE SURE YOU:   Understand these instructions.  Will watch your condition.  Will get help right away if you are not doing well or get worse. Document Released: 05/27/2008 Document Revised: 11/10/2011 Document Reviewed: 05/27/2008 [facility] Patient Information 2015 [place], [place]. This information is not intended to replace advice given to you by your health care provider. Make sure you discuss any questions you have with your health care provider.  Musculoskeletal Pain Musculoskeletal pain is muscle and boney aches and pains. These pains can occur in any part of the body. Your caregiver may treat you without knowing the cause of the pain. They may treat you if  blood or urine tests, X-rays, and other tests were normal.  CAUSES There is often not a definite cause or reason for these pains. These pains may be caused by a type of germ (virus). The discomfort may also come from overuse. Overuse includes working out too hard when your body is not fit. Boney aches also come from weather changes. Bone is sensitive to atmospheric pressure changes. HOME CARE INSTRUCTIONS   Ask when your test results will be ready. Make sure you get your test results.  Only take over-the-counter or prescription medicines for pain, discomfort, or fever as directed by your caregiver. If you were given medications for your condition, do not drive, operate machinery or power tools, or sign legal documents for 24 hours. Do not drink alcohol. Do not take sleeping pills or other medications that may interfere with treatment.  Continue all activities unless the activities cause more pain. When the pain lessens, slowly resume normal activities. Gradually increase the intensity and duration of the activities or exercise.  During periods of severe pain, bed rest may be helpful. Lay or sit in any position that is comfortable.  Putting ice on the injured area.  Put ice in  a bag.  Place a towel between your skin and the bag.  Leave the ice on for 15 to 20 minutes, 3 to 4 times a day.  Follow up with your caregiver for continued problems and no reason can be found for the pain. If the pain becomes worse or does not go away, it may be necessary to repeat tests or do additional testing. Your caregiver may need to look further for a possible cause. SEEK IMMEDIATE MEDICAL CARE IF:  You have pain that is getting worse and is not relieved by medications.  You develop chest pain that is associated with shortness or breath, sweating, feeling sick to your stomach (nauseous), or throw up (vomit).  Your pain becomes localized to the abdomen.  You develop any new symptoms that seem different or  that concern you. MAKE SURE YOU:   Understand these instructions.  Will watch your condition.  Will get help right away if you are not doing well or get worse. Document Released: 08/18/2005 Document Revised: 11/10/2011 Document Reviewed: 04/22/2013 [facility] Patient Information 2015 [place], [place]. This information is not intended to replace advice given to you by your health care provider. Make sure you discuss any questions you have with your health care provider.

## 2015-03-22 NOTE — ED Notes (Signed)
Patient ambulatory to triage with steady gait, without difficulty or distress noted; pt reports having body aches x 2 days; taking ibuprofen without relief; st "feel like I'm getting a cold, having headaches"

## 2015-03-22 NOTE — ED Provider Notes (Signed)
[facility] Emergency Department Provider Note  ____________________________________________  Time seen: Approximately 3:00 AM  I have reviewed the triage vital signs and the nursing notes.   HISTORY  Chief Complaint Generalized Body Aches and Headache    HPI Patricia Allison is a 24 y.o. female who presents to the ED from home with a 3 day history of whole body aches, headache and nausea. Patient states she has not felt well to go to work at [employer]. States she is outdoors in the heat for prolonged periods of time.Denies fever, chills, cough, chest pain, shortness of breath, vomiting, diarrhea. States she feels dehydrated and generally weak.   Past Medical History  Diagnosis Date  . Depression   . Eating disorder   . Fainting spell     Patient Active Problem List   Diagnosis Date Noted  . Goiter 10/17/2013  . Anemia 10/05/2013  . ADHD (attention deficit hyperactivity disorder) 10/05/2013  . Acne 10/05/2013  . History of depression 10/05/2013  . Loss of weight 10/05/2013  . Routine general medical examination at a health care facility 10/05/2013  . Smoker 10/05/2013  . Marijuana use 10/05/2013    History reviewed. No pertinent past surgical history.  Current Outpatient Rx  Name  Route  Sig  Dispense  Refill  . clindamycin-benzoyl peroxide (BENZACLIN) gel   Topical   Apply topically 2 (two) times daily. To affected areas for acne   50 g   1   . iron polysaccharides (NU-IRON) 150 MG capsule   Oral   Take 1 capsule (150 mg total) by mouth 2 (two) times daily.   60 capsule   5     Allergies Review of patient's allergies indicates no known allergies.  Family History  Problem Relation Age of Onset  . Alcohol abuse Maternal Uncle     Social History History  Substance Use Topics  . Smoking status: Current Every Day Smoker -- 0.30 packs/day    Types: Cigarettes  . Smokeless tobacco: Never Used  . Alcohol Use: Yes     Comment:  occ    Review of Systems Constitutional: Positive for generalized weakness. No fever/chills Eyes: No visual changes. ENT: No sore throat. Cardiovascular: Denies chest pain. Respiratory: Denies shortness of breath. Gastrointestinal: No abdominal pain.  Positive for nausea, no vomiting.  No diarrhea.  No constipation. Genitourinary: Negative for dysuria. Musculoskeletal: Positive for whole body aches. Skin: Negative for rash. Neurological: Negative for headaches, focal weakness or numbness.  10-point ROS otherwise negative.  ____________________________________________   PHYSICAL EXAM:  VITAL SIGNS: ED Triage Vitals  Enc Vitals Group     BP 03/22/15 0031 115/86 mmHg     Pulse Rate 03/22/15 0031 81     Resp 03/22/15 0031 18     Temp 03/22/15 0031 98 F (36.7 C)     Temp Source 03/22/15 0031 Oral     SpO2 03/22/15 0031 100 %     Weight 03/22/15 0031 115 lb (52.164 kg)     Height 03/22/15 0031 5\' 5"  (1.651 m)     Head Cir --      Peak Flow --      Pain Score 03/22/15 0031 8     Pain Loc --      Pain Edu? --      Excl. in GC? --     Constitutional: Alert and oriented. Well appearing and in no acute distress. Eyes: Conjunctivae are normal. PERRL. EOMI. Head: Atraumatic. Nose: No congestion/rhinnorhea. Mouth/Throat:  Mucous membranes are mildly dry.  Oropharynx non-erythematous. Neck: No stridor. No carotid bruits. Supple neck without signs of meningismus. Cardiovascular: Normal rate, regular rhythm. Grossly normal heart sounds.  Good peripheral circulation. Respiratory: Normal respiratory effort.  No retractions. Lungs CTAB. Gastrointestinal: Soft and nontender. No distention. No abdominal bruits. No CVA tenderness. Musculoskeletal: No lower extremity tenderness nor edema.  No joint effusions. Neurologic:  Normal speech and language. No gross focal neurologic deficits are appreciated. No gait instability. Skin:  Skin is warm, and intact. No rash noted. Psychiatric: Mood  and affect are normal. Speech and behavior are normal.  ____________________________________________   LABS (all labs ordered are listed, but only abnormal results are displayed)  Labs Reviewed  CBC WITH DIFFERENTIAL/PLATELET - Abnormal; Notable for the following:    RBC 3.74 (*)    Hemoglobin 10.6 (*)    HCT 32.7 (*)    RDW 18.3 (*)    All other components within normal limits  BASIC METABOLIC PANEL - Abnormal; Notable for the following:    BUN 22 (*)    All other components within normal limits  CK - Abnormal; Notable for the following:    Total CK 285 (*)    All other components within normal limits   ____________________________________________  EKG  None ____________________________________________  RADIOLOGY  None ____________________________________________   PROCEDURES  Procedure(s) performed: None  Critical Care performed: No  ____________________________________________   INITIAL IMPRESSION / ASSESSMENT AND PLAN / ED COURSE  Pertinent labs & imaging results that were available during my care of the patient were reviewed by me and considered in my medical decision making (see chart for details).  24 year old female who complains of myalgias likely secondary to heat illness. Will insert IV for fluid resuscitation, check electrolytes including CK and reassess.  ----------------------------------------- 4:34 AM on 03/22/2015 -----------------------------------------  Patient improved. Resting comfortably in no acute distress. Strict return precautions given. Patient verbalizes understanding and agrees to plan of care. ____________________________________________   FINAL CLINICAL IMPRESSION(S) / ED DIAGNOSES  Final diagnoses:  Heat effect, initial encounter  Myalgia      Irean Hong, MD 03/22/15 [phone]

## 2015-03-22 NOTE — ED Notes (Addendum)
Patient present to ED with complaint of "bodyaches from neck to lower back." and left parietal headache since Monday. Denies photo sensitivity. Patient reports intermittent chest pain with ambulation, back pain increases with movement. Patient denies abdominal pain, fevers, N/V/D, or shortness of breath. Patient denies any known injury. Patient alert and oriented x 4, calm and cooperative, call bell within reach.

## 2015-10-24 ENCOUNTER — Emergency Department
Admission: EM | Admit: 2015-10-24 | Discharge: 2015-10-24 | Disposition: A | Discharge status: Home / Self Care | Payer: BLUE CROSS/BLUE SHIELD | Attending: Emergency Medicine | Admitting: Emergency Medicine

## 2015-10-24 ENCOUNTER — Emergency Department: Payer: BLUE CROSS/BLUE SHIELD

## 2015-10-24 DIAGNOSIS — N838 Other noninflammatory disorders of ovary, fallopian tube and broad ligament: Secondary | ICD-10-CM | POA: Diagnosis not present

## 2015-10-24 DIAGNOSIS — R6883 Chills (without fever): Secondary | ICD-10-CM | POA: Diagnosis not present

## 2015-10-24 DIAGNOSIS — Z79899 Other long term (current) drug therapy: Secondary | ICD-10-CM | POA: Diagnosis not present

## 2015-10-24 DIAGNOSIS — F17210 Nicotine dependence, cigarettes, uncomplicated: Secondary | ICD-10-CM | POA: Diagnosis not present

## 2015-10-24 DIAGNOSIS — Z792 Long term (current) use of antibiotics: Secondary | ICD-10-CM | POA: Diagnosis not present

## 2015-10-24 DIAGNOSIS — Z3202 Encounter for pregnancy test, result negative: Secondary | ICD-10-CM | POA: Diagnosis not present

## 2015-10-24 DIAGNOSIS — N83202 Unspecified ovarian cyst, left side: Secondary | ICD-10-CM | POA: Insufficient documentation

## 2015-10-24 DIAGNOSIS — R1032 Left lower quadrant pain: Secondary | ICD-10-CM

## 2015-10-24 DIAGNOSIS — N83209 Unspecified ovarian cyst, unspecified side: Secondary | ICD-10-CM

## 2015-10-24 LAB — URINALYSIS COMPLETE WITH MICROSCOPIC (ARMC ONLY)
BILIRUBIN URINE: NEGATIVE
Glucose, UA: NEGATIVE mg/dL
HGB URINE DIPSTICK: NEGATIVE
KETONES UR: NEGATIVE mg/dL
LEUKOCYTES UA: NEGATIVE
Nitrite: NEGATIVE
PH: 6 (ref 5.0–8.0)
PROTEIN: NEGATIVE mg/dL
Specific Gravity, Urine: 1.023 (ref 1.005–1.030)

## 2015-10-24 LAB — COMPREHENSIVE METABOLIC PANEL
ALT: 16 U/L (ref 14–54)
ANION GAP: 9 (ref 5–15)
AST: 27 U/L (ref 15–41)
Albumin: 4.2 g/dL (ref 3.5–5.0)
Alkaline Phosphatase: 30 U/L — ABNORMAL LOW (ref 38–126)
BUN: 11 mg/dL (ref 6–20)
CHLORIDE: 108 mmol/L (ref 101–111)
CO2: 22 mmol/L (ref 22–32)
Calcium: 9.3 mg/dL (ref 8.9–10.3)
Creatinine, Ser: 0.51 mg/dL (ref 0.44–1.00)
GFR calc Af Amer: >60 mL/min (ref >60)
Glucose, Bld: 98 mg/dL (ref 65–99)
POTASSIUM: 3.9 mmol/L (ref 3.5–5.1)
Sodium: 139 mmol/L (ref 135–145)
TOTAL PROTEIN: 7.5 g/dL (ref 6.5–8.1)
Total Bilirubin: 0.6 mg/dL (ref 0.3–1.2)

## 2015-10-24 LAB — CBC
HEMATOCRIT: 31.7 % — AB (ref 35.0–47.0)
HEMOGLOBIN: 10.2 g/dL — AB (ref 12.0–16.0)
MCH: 27.7 pg (ref 26.0–34.0)
MCHC: 32.2 g/dL (ref 32.0–36.0)
MCV: 85.9 fL (ref 80.0–100.0)
Platelets: 300 10*3/uL (ref 150–440)
RBC: 3.69 MIL/uL — ABNORMAL LOW (ref 3.80–5.20)
RDW: 17.9 % — AB (ref 11.5–14.5)
WBC: 4.7 10*3/uL (ref 3.6–11.0)

## 2015-10-24 LAB — LIPASE, BLOOD: LIPASE: 26 U/L (ref 11–51)

## 2015-10-24 LAB — POCT PREGNANCY, URINE: Preg Test, Ur: NEGATIVE

## 2015-10-24 MED ORDER — OXYCODONE-ACETAMINOPHEN 5-325 MG PO TABS: 1.0000 | ORAL_TABLET | Freq: Four times a day (QID) | ORAL | Status: DC | PRN

## 2015-10-24 MED ORDER — ONDANSETRON 4 MG PO TBDP: 4.0000 mg | ORAL_TABLET | Freq: Three times a day (TID) | ORAL | Status: DC | PRN

## 2015-10-24 MED ORDER — ONDANSETRON 8 MG PO TBDP
8.0000 mg | ORAL_TABLET | Freq: Once | ORAL | Status: AC
  Administered 2015-10-24: 8 mg via ORAL
  Filled 2015-10-24: qty 1

## 2015-10-24 MED ORDER — KETOROLAC TROMETHAMINE 30 MG/ML IJ SOLN
30.0000 mg | Freq: Once | INTRAMUSCULAR | Status: AC
  Administered 2015-10-24: 30 mg via INTRAMUSCULAR
  Filled 2015-10-24: qty 1

## 2015-10-24 MED ORDER — IBUPROFEN 200 MG PO TABS: 600.0000 mg | ORAL_TABLET | Freq: Four times a day (QID) | ORAL | Status: DC | PRN

## 2015-10-24 NOTE — ED Provider Notes (Signed)
Patient feeling much better. Pain has essentially resolved after IM Toradol. Calm comfortable, vital signs stable and normal.  Sharman Cheek, MD 10/24/15 1440

## 2015-10-24 NOTE — ED Notes (Signed)
Pt discharged home after verbalizing understanding of discharge instructions; nad noted. 

## 2015-10-24 NOTE — Discharge Instructions (Signed)
Your ultrasound shows that you most likely had a ruptured ovarian cyst which is causing your abdominal pain. However, the ultrasound does show a remaining small mass in the area of the left ovary. Please follow-up with gynecology for further evaluation of this finding.  Take ibuprofen for pain, and percocet as needed for severe pain not controlled by ibuprofen.   You were prescribed a medication that is potentially sedating. Do not drink alcohol, drive or participate in any other potentially dangerous activities while taking this medication as it may make you sleepy. Do not take this medication with any other sedating medications, either prescription or over-the-counter. If you were prescribed Percocet or Vicodin, do not take these with acetaminophen (Tylenol) as it is already contained within these medications.   Opioid pain medications (or "narcotics") can be habit forming.  Use it as little as possible to achieve adequate pain control.  Do not use or use it with extreme caution if you have a history of opiate abuse or dependence.  If you are on a pain contract with your primary care doctor or a pain specialist, be sure to let them know you were prescribed this medication today from the [facility] Emergency Department.  This medication is intended for your use only - do not give any to anyone else and keep it in a secure place where nobody else, especially children and pets, have access to it.  It will also cause or worsen constipation, so you may want to consider taking an over-the-counter stool softener while you are taking this medication.   Abdominal Pain, Adult Many things can cause abdominal pain. Usually, abdominal pain is not caused by a disease and will improve without treatment. It can often be observed and treated at home. Your health care provider will do a physical exam and possibly order blood tests and X-rays to help determine the seriousness of your pain. However, in many cases, more  time must pass before a clear cause of the pain can be found. Before that point, your health care provider may not know if you need more testing or further treatment. HOME CARE INSTRUCTIONS Monitor your abdominal pain for any changes. The following actions may help to alleviate any discomfort you are experiencing:  Only take over-the-counter or prescription medicines as directed by your health care provider.  Do not take laxatives unless directed to do so by your health care provider.  Try a clear liquid diet (broth, tea, or water) as directed by your health care provider. Slowly move to a bland diet as tolerated. SEEK MEDICAL CARE IF:  You have unexplained abdominal pain.  You have abdominal pain associated with nausea or diarrhea.  You have pain when you urinate or have a bowel movement.  You experience abdominal pain that wakes you in the night.  You have abdominal pain that is worsened or improved by eating food.  You have abdominal pain that is worsened with eating fatty foods.  You have a fever. SEEK IMMEDIATE MEDICAL CARE IF:  Your pain does not go away within 2 hours.  You keep throwing up (vomiting).  Your pain is felt only in portions of the abdomen, such as the right side or the left lower portion of the abdomen.  You pass bloody or black tarry stools. MAKE SURE YOU:  Understand these instructions.  Will watch your condition.  Will get help right away if you are not doing well or get worse.   This information is not intended to  replace advice given to you by your health care provider. Make sure you discuss any questions you have with your health care provider.   Document Released: 05/28/2005 Document Revised: 05/09/2015 Document Reviewed: 04/27/2013 Elsevier Interactive Patient Education [employer]

## 2015-10-24 NOTE — ED Notes (Signed)
Pt arrives to ER via POV from [facility] c/o back pain and chills X 2 days. Abdominal pain and vomiting. Pt alert and oriented X4, active, cooperative, pt in NAD. RR even and unlabored, color WNL.

## 2015-10-24 NOTE — ED Provider Notes (Signed)
[facility] Emergency Department Provider Note  ____________________________________________  Time seen: 12:30 PM  I have reviewed the triage vital signs and the nursing notes.   HISTORY  Chief Complaint Back Pain and Chills    HPI Patricia Allison is a 25 y.o. female who complains of sharp left lower quadrant pain radiating to her left lower back with chills for the last 2 days. She's also had some nausea and vomiting. She is tolerating oral intake and overall very active. No aggravating or alleviating factors. No fever or diarrhea. No prior surgeries. No history of ovarian cyst. No vaginal bleeding or discharge     Past Medical History  Diagnosis Date  . Depression   . Eating disorder   . Fainting spell      Patient Active Problem List   Diagnosis Date Noted  . Goiter 10/17/2013  . Anemia 10/05/2013  . ADHD (attention deficit hyperactivity disorder) 10/05/2013  . Acne 10/05/2013  . History of depression 10/05/2013  . Loss of weight 10/05/2013  . Routine general medical examination at a health care facility 10/05/2013  . Smoker 10/05/2013  . Marijuana use 10/05/2013     History reviewed. No pertinent past surgical history.   Current Outpatient Rx  Name  Route  Sig  Dispense  Refill  . clindamycin-benzoyl peroxide (BENZACLIN) gel   Topical   Apply topically 2 (two) times daily. To affected areas for acne   50 g   1   . ibuprofen (MOTRIN IB) 200 MG tablet   Oral   Take 3 tablets (600 mg total) by mouth every 6 (six) hours as needed.   60 tablet   0   . iron polysaccharides (NU-IRON) 150 MG capsule   Oral   Take 1 capsule (150 mg total) by mouth 2 (two) times daily.   60 capsule   5   . ondansetron (ZOFRAN ODT) 4 MG disintegrating tablet   Oral   Take 1 tablet (4 mg total) by mouth every 8 (eight) hours as needed for nausea or vomiting.   20 tablet   0   . oxyCODONE-acetaminophen (ROXICET) 5-325 MG tablet   Oral   Take 1  tablet by mouth every 6 (six) hours as needed for severe pain.   6 tablet   0      Allergies Review of patient's allergies indicates no known allergies.   Family History  Problem Relation Age of Onset  . Alcohol abuse Maternal Uncle     Social History Social History  Substance Use Topics  . Smoking status: Current Every Day Smoker -- 0.30 packs/day    Types: Cigarettes  . Smokeless tobacco: Never Used  . Alcohol Use: Yes     Comment: occ    Review of Systems  Constitutional:   No or positive chills No weight changes Eyes:   No blurry vision or double vision.  ENT:   No sore throat.  Cardiovascular:   No chest pain. Respiratory:   No dyspnea or cough. Gastrointestinal:   Positive as above for abdominal pain with vomiting.  No BRBPR or melena. Genitourinary:   Negative for dysuria or difficulty urinating. Musculoskeletal:   Negative for back pain. No joint swelling or pain. Skin:   Negative for rash. Neurological:   Negative for headaches, focal weakness or numbness. Psychiatric:  No anxiety or depression.   Endocrine:  No changes in energy or sleep difficulty.  10-point ROS otherwise negative.  ____________________________________________   PHYSICAL EXAM:  VITAL SIGNS: ED Triage Vitals  Enc Vitals Group     BP 10/24/15 1053 137/64 mmHg     Pulse Rate 10/24/15 1053 74     Resp 10/24/15 1053 16     Temp 10/24/15 1053 98.1 F (36.7 C)     Temp Source 10/24/15 1053 Oral     SpO2 10/24/15 1053 100 %     Weight 10/24/15 1053 115 lb (52.164 kg)     Height 10/24/15 1053  (1.651 m)     Head Cir --      Peak Flow --      Pain Score 10/24/15 1054 9     Pain Loc --      Pain Edu? --      Excl. in GC? --     Vital signs reviewed, nursing assessments reviewed.   Constitutional:   Alert and oriented. Well appearing and in no distress. Eyes:   No scleral icterus. No conjunctival pallor. PERRL. EOMI ENT   Head:   Normocephalic and atraumatic.    Nose:   No congestion/rhinnorhea. No septal hematoma   Mouth/Throat:   MMM, no pharyngeal erythema. No peritonsillar mass.    Neck:   No stridor. No SubQ emphysema. No meningismus. Hematological/Lymphatic/Immunilogical:   No cervical lymphadenopathy. Cardiovascular:   RRR. Symmetric bilateral radial and DP pulses.  No murmurs.  Respiratory:   Normal respiratory effort without tachypnea nor retractions. Breath sounds are clear and equal bilaterally. No wheezes/rales/rhonchi. Gastrointestinal:   Soft with left lower quadrant tenderness. Non distended. There is no CVA tenderness.  No rebound, rigidity, or guarding. Genitourinary:   deferred Musculoskeletal:   Nontender with normal range of motion in all extremities. No joint effusions.  No lower extremity tenderness.  No edema. No midline spinal tenderness Neurologic:   Normal speech and language.  CN 2-10 normal. Motor grossly intact. No gross focal neurologic deficits are appreciated.  Skin:    Skin is warm, dry and intact. No rash noted.  No petechiae, purpura, or bullae. Psychiatric:   Mood and affect are normal. ____________________________________________    LABS (pertinent positives/negatives) (all labs ordered are listed, but only abnormal results are displayed) Labs Reviewed  COMPREHENSIVE METABOLIC PANEL - Abnormal; Notable for the following:    Alkaline Phosphatase 30 (*)    All other components within normal limits  CBC - Abnormal; Notable for the following:    RBC 3.69 (*)    Hemoglobin 10.2 (*)    HCT 31.7 (*)    RDW 17.9 (*)    All other components within normal limits  URINALYSIS COMPLETEWITH MICROSCOPIC ([facility] ONLY) - Abnormal; Notable for the following:    Color, Urine YELLOW (*)    APPearance HAZY (*)    Bacteria, UA RARE (*)    Squamous Epithelial / LPF 6-30 (*)    All other components within normal limits  LIPASE, BLOOD  POC URINE PREG, ED  POCT PREGNANCY, URINE    ____________________________________________   EKG    ____________________________________________    RADIOLOGY  Ultrasound pelvis with Doppler shows no evidence of ovarian torsion, good blood flow bilaterally. There is some free fluid likely due to recent ovarian cyst rupture. There is also approximately 3 cm left adnexal mass.  ____________________________________________   PROCEDURES   ____________________________________________   INITIAL IMPRESSION / ASSESSMENT AND PLAN / ED COURSE  Pertinent labs & imaging results that were available during my care of the patient were reviewed by me and considered in my medical decision  making (see chart for details).  Patient presents with left lower quadrant pain concerning for torsion. Ultrasound shows no evidence of torsion there is a small ovarian mass. Patient formerly's findings, she'll follow up with gynecology. Also there is some evidence that her pain is most likely due to a recently ruptured ovarian cyst. We'll give her pain control and again have her follow-up. She is well-appearing no acute distress, labs and vitals are stable. No evidence of urinary tract infection, low suspicion for TOA or PID.     ____________________________________________   FINAL CLINICAL IMPRESSION(S) / ED DIAGNOSES  Final diagnoses:  Ruptured ovarian cyst  Ovarian mass, left      Sharman Cheek, MD 10/24/15 1435

## 2016-10-09 ENCOUNTER — Encounter: Payer: Self-pay | Admitting: Emergency Medicine

## 2016-10-09 ENCOUNTER — Emergency Department
Admission: EM | Admit: 2016-10-09 | Discharge: 2016-10-09 | Disposition: A | Discharge status: Home / Self Care | Payer: BLUE CROSS/BLUE SHIELD | Attending: Emergency Medicine | Admitting: Emergency Medicine

## 2016-10-09 DIAGNOSIS — F17210 Nicotine dependence, cigarettes, uncomplicated: Secondary | ICD-10-CM | POA: Insufficient documentation

## 2016-10-09 DIAGNOSIS — F909 Attention-deficit hyperactivity disorder, unspecified type: Secondary | ICD-10-CM | POA: Insufficient documentation

## 2016-10-09 DIAGNOSIS — R110 Nausea: Secondary | ICD-10-CM

## 2016-10-09 DIAGNOSIS — Z79899 Other long term (current) drug therapy: Secondary | ICD-10-CM | POA: Insufficient documentation

## 2016-10-09 DIAGNOSIS — R197 Diarrhea, unspecified: Secondary | ICD-10-CM

## 2016-10-09 DIAGNOSIS — B349 Viral infection, unspecified: Secondary | ICD-10-CM | POA: Insufficient documentation

## 2016-10-09 MED ORDER — ONDANSETRON 4 MG PO TBDP: 4.0000 mg | ORAL_TABLET | Freq: Three times a day (TID) | ORAL | 0 refills | Status: DC | PRN

## 2016-10-09 NOTE — ED Provider Notes (Signed)
[facility] Emergency Department Provider Note  ____________________________________________  Time seen: Approximately 7:57 PM  I have reviewed the triage vital signs and the nursing notes.   HISTORY  Chief Complaint Generalized Body Aches; Chills; and Nausea    HPI Patricia Allison is a 26 y.o. female , NAD, presents to the emergency room with several hour history of body aches, nausea and diarrhea. Patient states she began feeling ill earlier this morning. Felt feverish but did not check her temperature. States she had to leave work early due to her nausea and diarrhea. Is hoping symptoms would pass through the day but states that her symptoms have persisted and she's had some mild fatigue. Denies any abdominal pain, vomiting, changes in urinary habits. Has had no chest pain, shortness of breath, wheezing, cough, chest congestion. Has had a mild headache but no sore throat, ear pain, sinus pressure, nasal congestion, runny nose. Denies any sick contacts. Has not taken anything over-the-counter for her symptoms.   Past Medical History:  Diagnosis Date  . Depression   . Eating disorder   . Fainting spell     Patient Active Problem List   Diagnosis Date Noted  . Goiter 10/17/2013  . Anemia 10/05/2013  . ADHD (attention deficit hyperactivity disorder) 10/05/2013  . Acne 10/05/2013  . History of depression 10/05/2013  . Loss of weight 10/05/2013  . Routine general medical examination at a health care facility 10/05/2013  . Smoker 10/05/2013  . Marijuana use 10/05/2013    History reviewed. No pertinent surgical history.  Prior to Admission medications   Medication Sig Start Date End Date Taking? Authorizing Provider  clindamycin-benzoyl peroxide (BENZACLIN) gel Apply topically 2 (two) times daily. To affected areas for acne 10/05/13   Judy PimpleMarne A Tower, MD  ibuprofen (MOTRIN IB) 200 MG tablet Take 3 tablets (600 mg total) by mouth every 6 (six) hours as needed.  10/24/15   Sharman CheekPhillip Stafford, MD  iron polysaccharides (NU-IRON) 150 MG capsule Take 1 capsule (150 mg total) by mouth 2 (two) times daily. 10/21/13   Judy PimpleMarne A Tower, MD  ondansetron (ZOFRAN ODT) 4 MG disintegrating tablet Take 1 tablet (4 mg total) by mouth every 8 (eight) hours as needed for nausea or vomiting. 10/09/16   Jami L Hagler, PA-C  oxyCODONE-acetaminophen (ROXICET) 5-325 MG tablet Take 1 tablet by mouth every 6 (six) hours as needed for severe pain. 10/24/15   Sharman CheekPhillip Stafford, MD    Allergies Patient has no known allergies.  Family History  Problem Relation Age of Onset  . Alcohol abuse Maternal Uncle     Social History Social History  Substance Use Topics  . Smoking status: Current Every Day Smoker    Packs/day: 0.30    Types: Cigarettes  . Smokeless tobacco: Never Used  . Alcohol use Yes     Comment: occ     Review of Systems  Constitutional: Positive subjective fevers and fatigue. No chills or rigors. Eyes: No visual changes.  ENT: No sore throat, nasal congestion, runny nose, ear pain, sinus pressure. Cardiovascular: No chest pain. Respiratory: No cough or chest congestion. No shortness of breath. No wheezing.  Gastrointestinal: Positive nausea, diarrhea. No abdominal pain.  No vomiting.  No constipation. Genitourinary: Negative for dysuria, hematuria. No urinary hesitancy, urgency or increased frequency. Musculoskeletal: Positive for general myalgias.  Skin: Negative for rash. Neurological: Positive for headaches, but no focal weakness or numbness. 10-point ROS otherwise negative.  ____________________________________________   PHYSICAL EXAM:  VITAL SIGNS: ED  Triage Vitals [10/09/16 1915]  Enc Vitals Group     BP 122/71     Pulse Rate (!) 103     Resp 18     Temp 98 F (36.7 C)     Temp Source Oral     SpO2 98 %     Weight 115 lb (52.2 kg)     Height 5\' 5"  (1.651 m)     Head Circumference      Peak Flow      Pain Score 7     Pain Loc      Pain  Edu?      Excl. in GC?      Constitutional: Alert and oriented. Well appearing and in no acute distress. Eyes: Conjunctivae are normal Without icterus, injection or discharge. Head: Atraumatic. ENT:      Ears: TMs visualized bilaterally without erythema, effusion, bulging, perforation.      Nose: No congestion/rhinnorhea.      Mouth/Throat: Mucous membranes are moist. Pharynx without erythema, swelling, exudate. Uvula is midline. Airway is patent. Neck: Supple with full range of motion. Hematological/Lymphatic/Immunilogical: No cervical lymphadenopathy. Cardiovascular: Normal rate, regular rhythm. Normal S1 and S2.  Good peripheral circulation. Respiratory: Normal respiratory effort without tachypnea or retractions. Lungs CTAB with breath sounds noted in all lung fields. No wheeze, rhonchi, rales. Gastrointestinal: Soft and nontender without distention or guarding in all quadrants. No rebound or rigidity. Bowel sounds grossly normoactive in all quadrants. Musculoskeletal: No lower extremity tenderness nor edema.  No joint effusions. Neurologic:  Normal speech and language. No gross focal neurologic deficits are appreciated.  Skin:  Skin is warm, dry and intact. No rash noted. Psychiatric: Mood and affect are normal. Speech and behavior are normal. Patient exhibits appropriate insight and judgement.   ____________________________________________   LABS  None ____________________________________________  EKG  None ____________________________________________  RADIOLOGY  None ____________________________________________    PROCEDURES  Procedure(s) performed: None   Procedures   Medications - No data to display   ____________________________________________   INITIAL IMPRESSION / ASSESSMENT AND PLAN / ED COURSE  Pertinent labs & imaging results that were available during my care of the patient were reviewed by me and considered in my medical decision making (see  chart for details).     Patient's diagnosis is consistent with nausea, diarrhea due to viral illness. Patient will be discharged home with prescriptions for Zofran to take as needed. Patient also given instructions on home care including foods that will limit diarrhea. Patient encouraged to push fluids such as water and Gatorade to stay well-hydrated. May take over-the-counter Tylenol or ibuprofen as needed for aches. Patient is to follow up with [facility] if symptoms persist past this treatment course. Patient is given ED precautions to return to the ED for any worsening or new symptoms.   ____________________________________________  FINAL CLINICAL IMPRESSION(S) / ED DIAGNOSES  Final diagnoses:  Nausea  Diarrhea, unspecified type  Viral illness      NEW MEDICATIONS STARTED DURING THIS VISIT:  New Prescriptions   ONDANSETRON (ZOFRAN ODT) 4 MG DISINTEGRATING TABLET    Take 1 tablet (4 mg total) by mouth every 8 (eight) hours as needed for nausea or vomiting.         Hope Pigeon, PA-C 10/09/16 2012    Sharman Cheek, MD 10/09/16 2330

## 2016-10-09 NOTE — ED Triage Notes (Signed)
Patient with complaint of body aches, nausea and chills that started today.

## 2016-10-09 NOTE — ED Notes (Signed)
See triage note, pt states body aches and nausea that started today. Pt states she did not have a thermometer to check temp however states she had chills. Pt states diarrhea in the am, denies blood in stool. Pt denies emesis. Pt A&O and in NAD at this time.

## 2017-05-13 IMAGING — US US TRANSVAGINAL NON-OB
1 series · 13 of 25 positions shown · non-contrast
Comparison: None.

CLINICAL DATA: One day history of left lower quadrant/ left pelvic
pain

EXAM:
TRANSABDOMINAL AND TRANSVAGINAL ULTRASOUND OF PELVIS
DOPPLER ULTRASOUND OF OVARIES
TECHNIQUE: Study was performed transabdominally to optimize pelvic field of
view evaluation and transvaginally to optimize internal visceral
architecture evaluation.
Color and duplex Doppler ultrasound was utilized to evaluate blood
flow to the ovaries.

[Series 1: us transvaginal non-ob · 0.15mm/px · 13 of 120 slices shown]
[im 1/120]
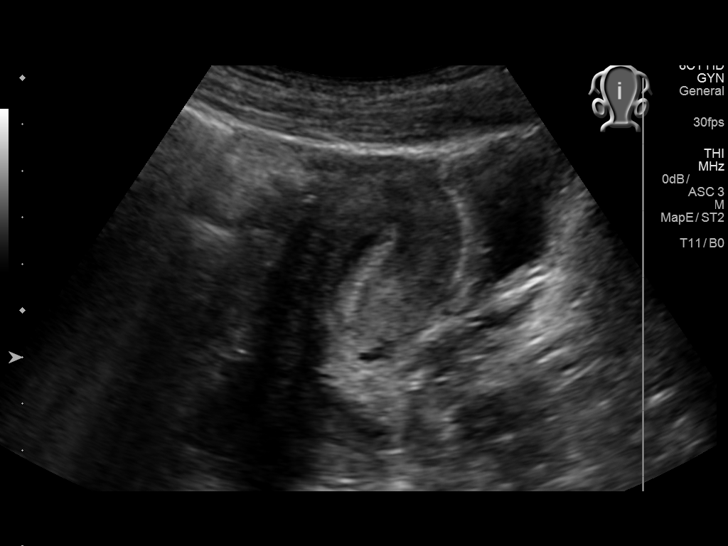
[im 10/120]
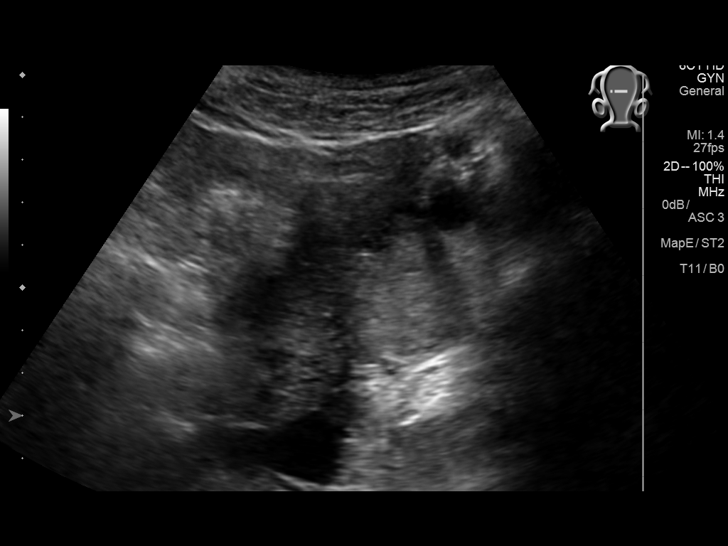
[im 20/120]
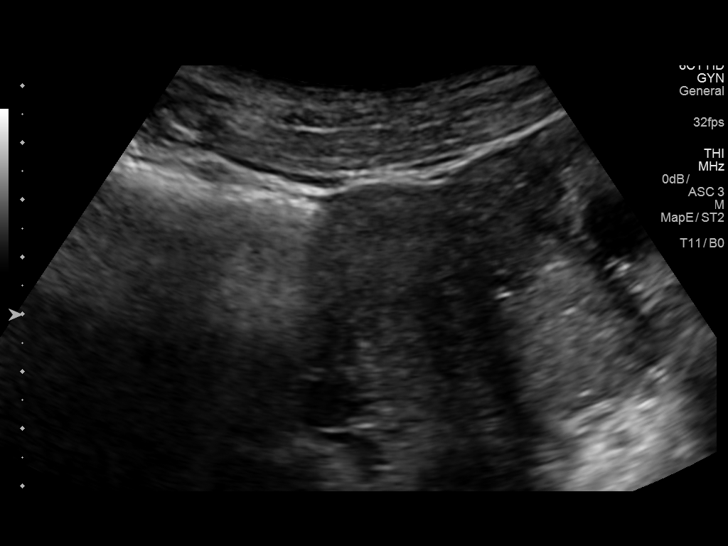
[im 30/120]
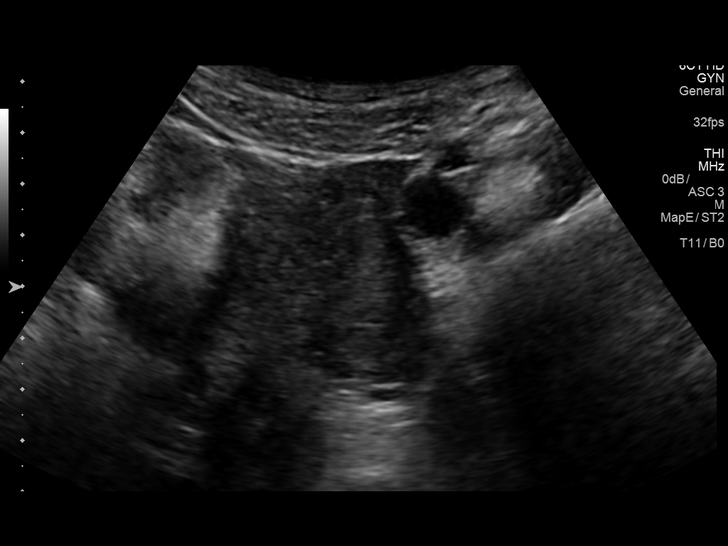
[im 40/120]
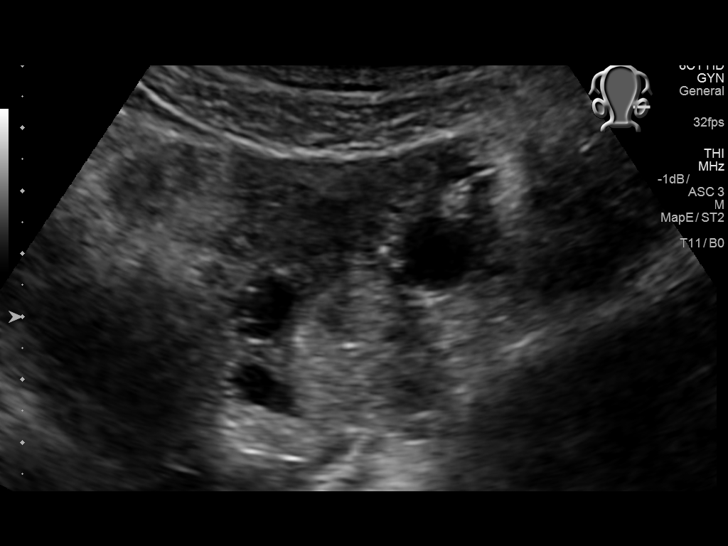
[im 50/120]
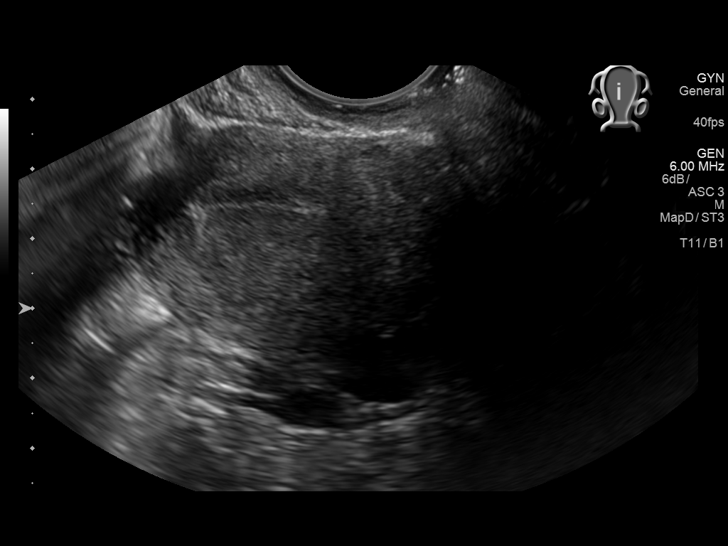
[im 60/120]
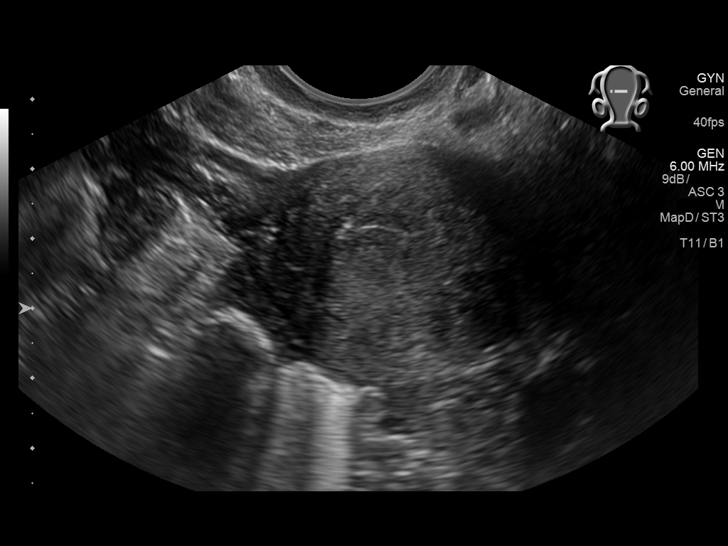
[im 70/120]
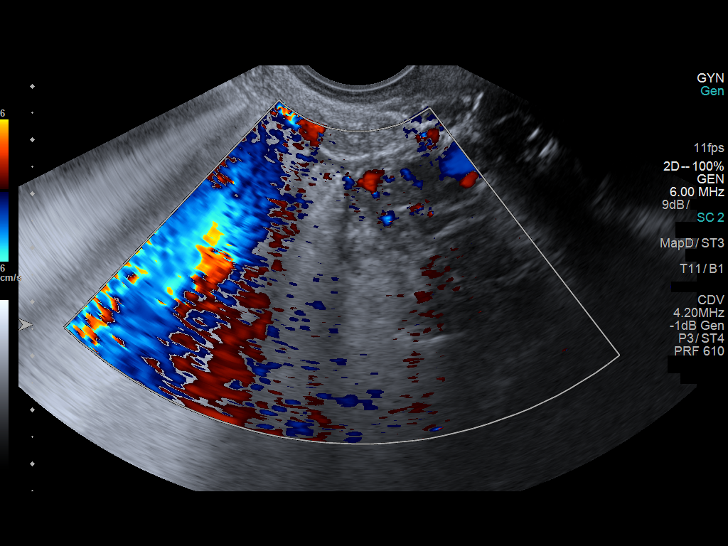
[im 80/120]
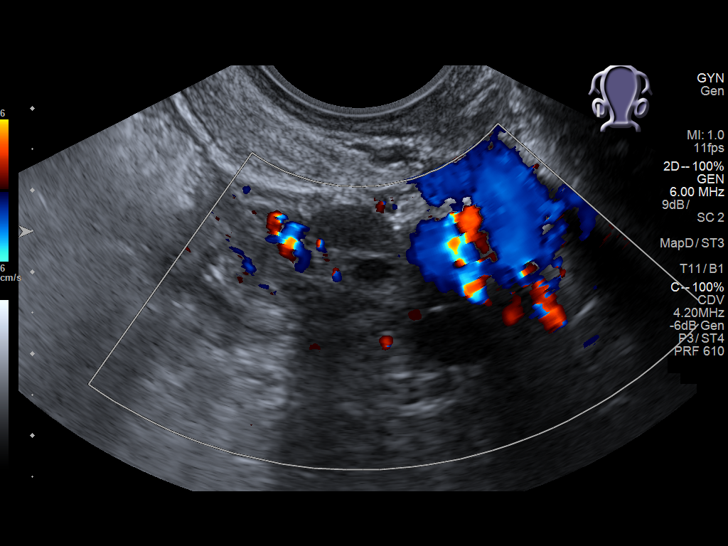
[im 90/120]
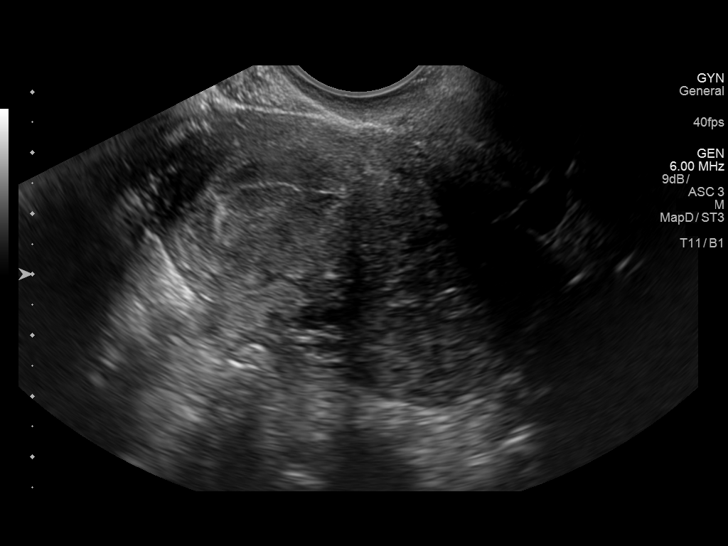
[im 100/120]
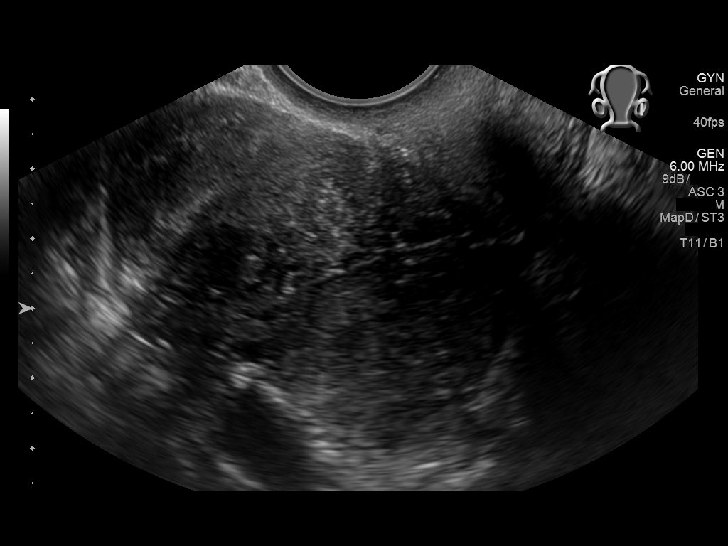
[im 110/120]
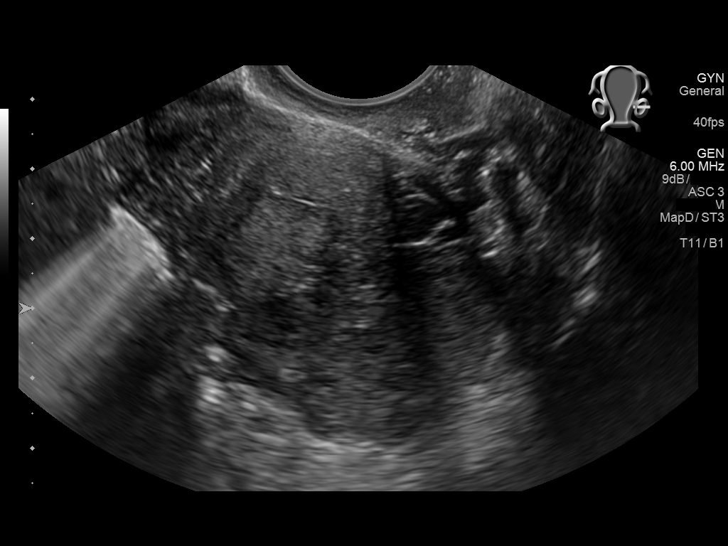
[im 120/120]
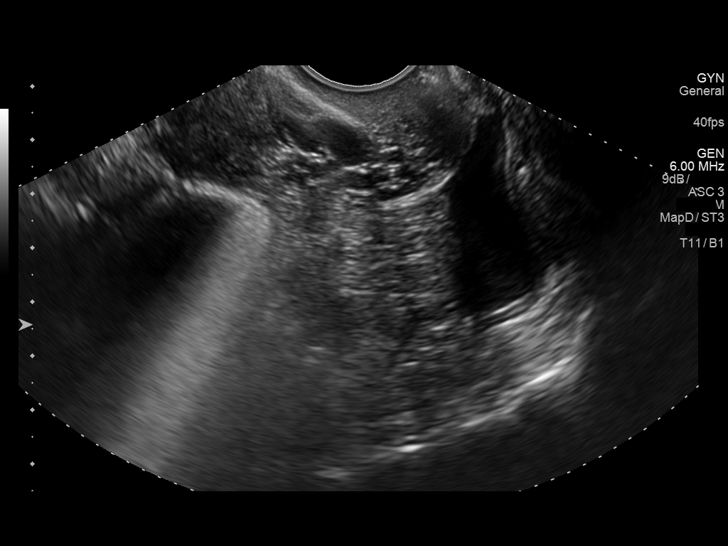

[13 of 25 positions shown; findings below may reference images not displayed]

FINDINGS: Uterus

Measurements: 7.0 x 4.5 x 3.4 cm. Uterus is anteverted. No fibroids
or other mass visualized.

Endometrium

Thickness: 8 mm.  No focal abnormality visualized.

Right ovary

Measurements: 2.8 x 1.8 x 2.2 cm. Normal appearance/no adnexal mass.

Left ovary

Measurements: 4.8 x 2.6 x 3.8 cm. There is a solid left adnexal mass
measuring 2.8 x 2.4 x 2.7 cm.

Pulsed Doppler evaluation of both ovaries demonstrates normal
low-resistance arterial and venous waveforms. The peak systolic
velocity in the left ovary is 7 cm/sec. The peak systolic velocity
in the right ovary is 8 cm/sec.

There is a moderate free fluid in the cul-de-sac.

Other findings

No abnormal free fluid.
IMPRESSION: Solid left ovarian region mass measuring 2.8 x 2.4 x 2.7 cm.
Differential considerations for this lesion include endometrioma,
ovarian neoplasm, or possibly infectious focus. Potentially, MR
could be helpful to further evaluate.

Free fluid in the cul-de-sac most likely is due to recent ovarian
cyst rupture.

No evidence of ovarian torsion on either side.

Uterus and endometrium appear unremarkable.

## 2017-07-26 ENCOUNTER — Emergency Department: Payer: Self-pay

## 2017-07-26 ENCOUNTER — Emergency Department
Admission: EM | Admit: 2017-07-26 | Discharge: 2017-07-26 | Disposition: A | Discharge status: Home / Self Care | Payer: Self-pay | Attending: Emergency Medicine | Admitting: Emergency Medicine

## 2017-07-26 ENCOUNTER — Encounter: Payer: Self-pay | Admitting: Emergency Medicine

## 2017-07-26 DIAGNOSIS — F10920 Alcohol use, unspecified with intoxication, uncomplicated: Secondary | ICD-10-CM | POA: Insufficient documentation

## 2017-07-26 DIAGNOSIS — R0602 Shortness of breath: Secondary | ICD-10-CM

## 2017-07-26 DIAGNOSIS — F329 Major depressive disorder, single episode, unspecified: Secondary | ICD-10-CM | POA: Insufficient documentation

## 2017-07-26 DIAGNOSIS — Z79899 Other long term (current) drug therapy: Secondary | ICD-10-CM | POA: Insufficient documentation

## 2017-07-26 DIAGNOSIS — F410 Panic disorder [episodic paroxysmal anxiety] without agoraphobia: Secondary | ICD-10-CM | POA: Insufficient documentation

## 2017-07-26 DIAGNOSIS — F17210 Nicotine dependence, cigarettes, uncomplicated: Secondary | ICD-10-CM | POA: Insufficient documentation

## 2017-07-26 LAB — COMPREHENSIVE METABOLIC PANEL
ALBUMIN: 4.2 g/dL (ref 3.5–5.0)
ALK PHOS: 39 U/L (ref 38–126)
ALT: 18 U/L (ref 14–54)
AST: 32 U/L (ref 15–41)
Anion gap: 11 (ref 5–15)
BUN: 10 mg/dL (ref 6–20)
CALCIUM: 9.2 mg/dL (ref 8.9–10.3)
CO2: 22 mmol/L (ref 22–32)
CREATININE: 0.76 mg/dL (ref 0.44–1.00)
Chloride: 104 mmol/L (ref 101–111)
GFR calc Af Amer: >60 mL/min (ref >60)
GFR calc non Af Amer: >60 mL/min (ref >60)
GLUCOSE: 90 mg/dL (ref 65–99)
POTASSIUM: 3 mmol/L — AB (ref 3.5–5.1)
SODIUM: 137 mmol/L (ref 135–145)
Total Bilirubin: 1.1 mg/dL (ref 0.3–1.2)
Total Protein: 7.8 g/dL (ref 6.5–8.1)

## 2017-07-26 LAB — CBC WITH DIFFERENTIAL/PLATELET
BASOS PCT: 1 %
Basophils Absolute: 0 10*3/uL (ref 0–0.1)
EOS ABS: 0 10*3/uL (ref 0–0.7)
EOS PCT: 0 %
HCT: 34.9 % — ABNORMAL LOW (ref 35.0–47.0)
HEMOGLOBIN: 11.8 g/dL — AB (ref 12.0–16.0)
Lymphocytes Relative: 39 %
Lymphs Abs: 2 10*3/uL (ref 1.0–3.6)
MCH: 31.6 pg (ref 26.0–34.0)
MCHC: 33.7 g/dL (ref 32.0–36.0)
MCV: 93.9 fL (ref 80.0–100.0)
Monocytes Absolute: 0.5 10*3/uL (ref 0.2–0.9)
Monocytes Relative: 9 %
NEUTROS PCT: 51 %
Neutro Abs: 2.6 10*3/uL (ref 1.4–6.5)
PLATELETS: 301 10*3/uL (ref 150–440)
RBC: 3.72 MIL/uL — ABNORMAL LOW (ref 3.80–5.20)
RDW: 15.3 % — ABNORMAL HIGH (ref 11.5–14.5)
WBC: 5.1 10*3/uL (ref 3.6–11.0)

## 2017-07-26 LAB — ETHANOL: ALCOHOL ETHYL (B): 118 mg/dL — AB (ref <10)

## 2017-07-26 MED ORDER — SODIUM CHLORIDE 0.9 % IV BOLUS (SEPSIS)
1000.0000 mL | Freq: Once | INTRAVENOUS | Status: AC
  Administered 2017-07-26: 1000 mL via INTRAVENOUS

## 2017-07-26 NOTE — ED Provider Notes (Signed)
[facility] Emergency Department Provider Note ____________________________________________   First MD Initiated Contact with Patient 07/26/17 [phone]     (approximate)  I have reviewed the triage vital signs and the nursing notes.   HISTORY  Chief Complaint Shortness of Breath and Anxiety  History of present illness limited due to mental status and intoxication.  HPI Patricia Allison is a 26 y.o. female with past medical history as noted below as well as history of anxiety who presents with apparent shortness of breath, associated with acute anxiety, and starting after a night of drinking with her friends.  Patient's girlfriend who is with her here states that this has happened to her before, but she usually just goes to sleep.  She states that today the symptoms persisted.  The patient's girlfriend denies that the patient used any illicit drugs or took any other medication, she states she is not on any medication in general.  She also states that there has been no trauma.   Past Medical History:  Diagnosis Date  . Depression   . Eating disorder   . Fainting spell     Patient Active Problem List   Diagnosis Date Noted  . Goiter 10/17/2013  . Anemia 10/05/2013  . ADHD (attention deficit hyperactivity disorder) 10/05/2013  . Acne 10/05/2013  . History of depression 10/05/2013  . Loss of weight 10/05/2013  . Routine general medical examination at a health care facility 10/05/2013  . Smoker 10/05/2013  . Marijuana use 10/05/2013    History reviewed. No pertinent surgical history.  Prior to Admission medications   Medication Sig Start Date End Date Taking? Authorizing Provider  clindamycin-benzoyl peroxide (BENZACLIN) gel Apply topically 2 (two) times daily. To affected areas for acne 10/05/13   Tower, Audrie GallusMarne A, MD  ibuprofen (MOTRIN IB) 200 MG tablet Take 3 tablets (600 mg total) by mouth every 6 (six) hours as needed. 10/24/15   Sharman CheekStafford, Phillip, MD    iron polysaccharides (NU-IRON) 150 MG capsule Take 1 capsule (150 mg total) by mouth 2 (two) times daily. 10/21/13   Tower, Audrie GallusMarne A, MD  ondansetron (ZOFRAN ODT) 4 MG disintegrating tablet Take 1 tablet (4 mg total) by mouth every 8 (eight) hours as needed for nausea or vomiting. 10/09/16   Hagler, Jami L, PA-C  oxyCODONE-acetaminophen (ROXICET) 5-325 MG tablet Take 1 tablet by mouth every 6 (six) hours as needed for severe pain. 10/24/15   Sharman CheekStafford, Phillip, MD    Allergies Patient has no known allergies.  Family History  Problem Relation Age of Onset  . Alcohol abuse Maternal Uncle     Social History Social History   Tobacco Use  . Smoking status: Current Every Day Smoker    Packs/day: 0.30    Types: Cigarettes  . Smokeless tobacco: Never Used  Substance Use Topics  . Alcohol use: Yes    Comment: occ  . Drug use: Yes    Types: Marijuana    Review of Systems Level V caveat: Unable to obtain review of systems due to mental status and intoxication.    ____________________________________________   PHYSICAL EXAM:  VITAL SIGNS: ED Triage Vitals  Enc Vitals Group     BP 07/26/17 0551 110/77     Pulse Rate 07/26/17 0551 98     Resp 07/26/17 0551 (!) 24     Temp 07/26/17 0551 98.3 F (36.8 C)     Temp Source 07/26/17 0551 Oral     SpO2 07/26/17 0551 100 %  Weight 07/26/17 0549 140 lb (63.5 kg)     Height 07/26/17 0549 5\' 5"  (1.651 m)     Head Circumference --      Peak Flow --      Pain Score --      Pain Loc --      Pain Edu? --      Excl. in GC? --     Constitutional: Somnolent but arousable to loud voice or tactile stimulation. Eyes: Conjunctivae are normal.  EOMI.  PERRLA. Head: Atraumatic. Nose: No congestion/rhinnorhea. Mouth/Throat: Mucous membranes are moist.   Neck: Normal range of motion.  Cardiovascular: Normal rate, regular rhythm. Grossly normal heart sounds.  Good peripheral circulation. Respiratory: Normal respiratory effort.  No retractions.  Lungs CTAB. Gastrointestinal:  No distention.  Musculoskeletal:   Extremities warm and well perfused.  Neurologic: Motor intact in all extremities.  Normal speech and language. No gross focal neurologic deficits are appreciated.  Skin:  Skin is warm and dry. No rash noted. Psychiatric: Somnolent.  ____________________________________________   LABS (all labs ordered are listed, but only abnormal results are displayed)  Labs Reviewed  CBC WITH DIFFERENTIAL/PLATELET - Abnormal; Notable for the following components:      Result Value   RBC 3.72 (*)    Hemoglobin 11.8 (*)    HCT 34.9 (*)    RDW 15.3 (*)    All other components within normal limits  COMPREHENSIVE METABOLIC PANEL - Abnormal; Notable for the following components:   Potassium 3.0 (*)    All other components within normal limits  ETHANOL - Abnormal; Notable for the following components:   Alcohol, Ethyl (B) 118 (*)    All other components within normal limits  URINE DRUG SCREEN, QUALITATIVE ([facility] ONLY)  POC URINE PREG, ED   ____________________________________________  EKG  ED ECG REPORT I, Dionne Bucy, the attending physician, personally viewed and interpreted this ECG.  Date: 07/26/2017 EKG Time: 553 Rate: 97 Rhythm: normal sinus rhythm QRS Axis: normal Intervals: normal ST/T Wave abnormalities: normal Narrative Interpretation: no evidence of acute ischemia or other acute findings  ____________________________________________  RADIOLOGY    ____________________________________________   PROCEDURES  Procedure(s) performed: No    Critical Care performed: No ____________________________________________   INITIAL IMPRESSION / ASSESSMENT AND PLAN / ED COURSE  Pertinent labs & imaging results that were available during my care of the patient were reviewed by me and considered in my medical decision making (see chart for details).  26 year old female with past medical history of anxiety  and other PMH as noted above presents with episode of shortness of breath associated with anxiety, and occurring after a night of drinking alcohol.  Patient's girlfriend states that she is had similar anxiety attacks in the past, although they usually resolve after short amount of time.  Review of past medical records in Epic is noncontributory.  On exam, vital signs are normal, the patient is sleeping comfortably and is somewhat somnolent but arousable to loud voice or tactile stimulation.  Her lungs are clear, and neuro is nonfocal, and there is no trauma.  Overall presentation consistent with anxiety/panic, likely exacerbated by alcohol and possible substance abuse.  Patient's girlfriend denies illicit substance use, but there is a strong smell of marijuana in the room.  Patient appears intoxicated, but is sleeping comfortably.  Plan for basic labs, and reassess to sobriety.  Patient had a chest x-ray ordered, however given that the symptoms appear to have resolved and the exam is normal, I have canceled  this for now.  If patient reports persistent shortness of breath when she wakes up, will reconsider.  Anticipate discharge home when patient sober.    ----------------------------------------- 9:11 AM on 07/26/2017 -----------------------------------------  Patient is now awake and alert.  She is oriented x3.  She is without acute complaints, and denies any current shortness of breath.  Vital signs are stable.  Her lab workup is unremarkable except for borderline low potassium, however there are no EKG changes or other symptoms suggestive of clinically significant hypokalemia.  She would like to go home.  At this time she is safe for discharge.  ____________________________________________   FINAL CLINICAL IMPRESSION(S) / ED DIAGNOSES  Final diagnoses:  Anxiety attack  Shortness of breath      NEW MEDICATIONS STARTED DURING THIS VISIT:  This SmartLink is deprecated. Use AVSMEDLIST  instead to display the medication list for a patient.   Note:  This document was prepared using Dragon voice recognition software and may include unintentional dictation errors.    Dionne BucySiadecki, Ellanor Feuerstein, MD 07/26/17 [phone]

## 2017-07-26 NOTE — ED Triage Notes (Signed)
Patient with complaint of shortness of breath that started tonight. Patient states that she has a history of panic attacks and that this feels similar. Patient states that she has drank about a pint of liquor tonight.

## 2017-07-26 NOTE — Discharge Instructions (Signed)
Return to the ER for any new, worsening, or persistent shortness of breath, weakness, lightheadedness, chest pain, or any other new or worsening symptoms that concern you.  You should follow-up with a primary care doctor within the next 1-2 weeks.  Your potassium level was slightly low, so this should be rechecked at some point within the next several weeks.

## 2017-07-26 NOTE — ED Notes (Signed)
Patient does not appear to be in any acute distress at time of discharge. Patient ambulatory to lobby with steady gate. Patient denies any comments or concerns regarding discharge.  

## 2017-10-26 ENCOUNTER — Emergency Department (HOSPITAL_COMMUNITY): Admission: EM | Admit: 2017-10-26 | Discharge: 2017-10-26 | Discharge status: Against Medical Advice | Payer: Self-pay

## 2017-10-26 NOTE — ED Notes (Signed)
2 calls in the lobby for triage and no response.

## 2017-10-27 ENCOUNTER — Other Ambulatory Visit: Payer: Self-pay

## 2017-10-27 ENCOUNTER — Emergency Department
Admission: EM | Admit: 2017-10-27 | Discharge: 2017-10-27 | Disposition: A | Discharge status: Home / Self Care | Payer: Self-pay | Attending: Emergency Medicine | Admitting: Emergency Medicine

## 2017-10-27 ENCOUNTER — Encounter: Payer: Self-pay | Admitting: Intensive Care

## 2017-10-27 DIAGNOSIS — Y9389 Activity, other specified: Secondary | ICD-10-CM | POA: Insufficient documentation

## 2017-10-27 DIAGNOSIS — S8002XA Contusion of left knee, initial encounter: Secondary | ICD-10-CM | POA: Insufficient documentation

## 2017-10-27 DIAGNOSIS — F909 Attention-deficit hyperactivity disorder, unspecified type: Secondary | ICD-10-CM | POA: Insufficient documentation

## 2017-10-27 DIAGNOSIS — W108XXA Fall (on) (from) other stairs and steps, initial encounter: Secondary | ICD-10-CM | POA: Insufficient documentation

## 2017-10-27 DIAGNOSIS — F17210 Nicotine dependence, cigarettes, uncomplicated: Secondary | ICD-10-CM | POA: Insufficient documentation

## 2017-10-27 DIAGNOSIS — F1290 Cannabis use, unspecified, uncomplicated: Secondary | ICD-10-CM | POA: Insufficient documentation

## 2017-10-27 DIAGNOSIS — Y92038 Other place in apartment as the place of occurrence of the external cause: Secondary | ICD-10-CM | POA: Insufficient documentation

## 2017-10-27 DIAGNOSIS — S8001XA Contusion of right knee, initial encounter: Secondary | ICD-10-CM | POA: Insufficient documentation

## 2017-10-27 DIAGNOSIS — F329 Major depressive disorder, single episode, unspecified: Secondary | ICD-10-CM | POA: Insufficient documentation

## 2017-10-27 DIAGNOSIS — Y998 Other external cause status: Secondary | ICD-10-CM | POA: Insufficient documentation

## 2017-10-27 DIAGNOSIS — Z79899 Other long term (current) drug therapy: Secondary | ICD-10-CM | POA: Insufficient documentation

## 2017-10-27 MED ORDER — NAPROXEN 500 MG PO TABS: 500.0000 mg | ORAL_TABLET | Freq: Two times a day (BID) | ORAL | 0 refills | Status: DC

## 2017-10-27 NOTE — ED Provider Notes (Signed)
[facility] Emergency Department Provider Note  ____________________________________________   First MD Initiated Contact with Patient 10/27/17 1010     (approximate)  I have reviewed the triage vital signs and the nursing notes.   HISTORY  Chief Complaint No chief complaint on file.   HPI Patricia Allison is a 27 y.o. female is here complaint of bilateral knee pain and headache.  Patient states that she fell down the stairs to her apartment 3 days ago when she was moving some stuff.  She denies any head injury or loss of consciousness.  She denies any visual changes, nausea or vomiting.  Patient states she has continued to ambulate.  She has not taken any over-the-counter medication for her pain.  It is her pain as an 8 out of 10.   Past Medical History:  Diagnosis Date  . Depression   . Eating disorder   . Fainting spell     Patient Active Problem List   Diagnosis Date Noted  . Goiter 10/17/2013  . Anemia 10/05/2013  . ADHD (attention deficit hyperactivity disorder) 10/05/2013  . Acne 10/05/2013  . History of depression 10/05/2013  . Loss of weight 10/05/2013  . Routine general medical examination at a health care facility 10/05/2013  . Smoker 10/05/2013  . Marijuana use 10/05/2013    History reviewed. No pertinent surgical history.  Prior to Admission medications   Medication Sig Start Date End Date Taking? Authorizing Provider  clindamycin-benzoyl peroxide (BENZACLIN) gel Apply topically 2 (two) times daily. To affected areas for acne 10/05/13   Tower, Audrie Gallus, MD  iron polysaccharides (NU-IRON) 150 MG capsule Take 1 capsule (150 mg total) by mouth 2 (two) times daily. 10/21/13   Tower, Audrie Gallus, MD  naproxen (NAPROSYN) 500 MG tablet Take 1 tablet (500 mg total) by mouth 2 (two) times daily with a meal. 10/27/17   Tommi Rumps, PA-C    Allergies Patient has no known allergies.  Family History  Problem Relation Age of Onset  . Alcohol  abuse Maternal Uncle     Social History Social History   Tobacco Use  . Smoking status: Current Every Day Smoker    Packs/day: 0.30    Types: Cigarettes  . Smokeless tobacco: Never Used  Substance Use Topics  . Alcohol use: Yes    Comment: weekends  . Drug use: Yes    Types: Marijuana    Review of Systems Constitutional: No fever/chills Eyes: No visual changes. ENT: Trauma Cardiovascular: Denies chest pain. Respiratory: Denies shortness of breath. Gastrointestinal: No abdominal pain.  No nausea, no vomiting.  Musculoskeletal: Positive bilateral knee pain. Skin: Negative for rash. Neurological: Positive for headaches, negative focal weakness or numbness. ____________________________________________   PHYSICAL EXAM:  VITAL SIGNS: ED Triage Vitals  Enc Vitals Group     BP 10/27/17 0915 137/83     Pulse Rate 10/27/17 0915 79     Resp 10/27/17 0915 18     Temp 10/27/17 0915 98.8 F (37.1 C)     Temp Source 10/27/17 0915 Oral     SpO2 10/27/17 0915 99 %     Weight 10/27/17 0924 140 lb (63.5 kg)     Height 10/27/17 0924 5\' 5"  (1.651 m)     Head Circumference --      Peak Flow --      Pain Score 10/27/17 0924 8     Pain Loc --      Pain Edu? --  Excl. in GC? --    Constitutional: Alert and oriented. Well appearing and in no acute distress. Eyes: Conjunctivae are normal. PERRL. EOMI. Head: Atraumatic. Nose: No trauma present. Neck: No stridor.  No cervical spine tenderness on palpation posteriorly.  Motion is unrestricted. Cardiovascular: Normal rate, regular rhythm. Grossly normal heart sounds.  Good peripheral circulation. Respiratory: Normal respiratory effort.  No retractions. Lungs CTAB. Gastrointestinal: Soft and nontender. No distention.  Musculoskeletal: Nontender to palpation thoracic and lumbar spine.  There is no difficulty with range of motion of the upper body.  On examination of bilateral knees there is no gross deformity or evidence of effusion  bilaterally.  There is no crepitus with range of motion.  Patient is able to flex and extend without any difficulty.  Skin is intact and no ecchymosis or abrasions were seen. Neurologic:  Normal speech and language. No gross focal neurologic deficits are appreciated. No gait instability. Skin:  Skin is warm, dry and intact. No rash noted. Psychiatric: Mood and affect are normal. Speech and behavior are normal.  ____________________________________________   LABS (all labs ordered are listed, but only abnormal results are displayed)  Labs Reviewed - No data to display PROCEDURES  Procedure(s) performed: None  Procedures  Critical Care performed: No  ____________________________________________   INITIAL IMPRESSION / ASSESSMENT AND PLAN / ED COURSE Patient was reassured that there is no evidence to indicate that there is a fracture and she also agrees.  Patient was given a prescription for naproxen 500 mg twice daily with food.  She is encouraged to take this medication for soreness.  She may use ice if needed for knee pain.  She will [facility] with [facility] acute care if any continued problems.  ____________________________________________   FINAL CLINICAL IMPRESSION(S) / ED DIAGNOSES  Final diagnoses:  Contusion of left knee, initial encounter  Contusion of right knee, initial encounter  Fall (on) (from) other stairs and steps, initial encounter     ED Discharge Orders        Ordered    naproxen (NAPROSYN) 500 MG tablet  2 times daily with meals     10/27/17 1121       Note:  This document was prepared using Dragon voice recognition software and may include unintentional dictation errors.    Tommi RumpsSummers, Rhonda L, PA-C 10/27/17 1313    Emily FilbertWilliams, Jonathan E, MD 10/27/17 [phone]

## 2017-10-27 NOTE — ED Notes (Signed)
See triage note  States she fell on Saturday  Having pain to right upper leg and head  Ambulates well  conts to have headache  No LOC

## 2017-10-27 NOTE — Discharge Instructions (Signed)
Follow-up with your primary care doctor or [facility] if any continued problems.  Apply ice to your knees when hurting.  Begin taking naproxen 500 mg twice daily with food.

## 2017-10-27 NOTE — ED Triage Notes (Signed)
Patient states when she was moving Saturday and fell down the stairs. C/o L knee pain and headache. Denies LOC. Ambulatory with no problems. No respiratory distress noted.

## 2018-01-19 ENCOUNTER — Emergency Department
Admission: EM | Admit: 2018-01-19 | Discharge: 2018-01-19 | Disposition: A | Discharge status: Home / Self Care | Payer: Self-pay | Attending: Emergency Medicine | Admitting: Emergency Medicine

## 2018-01-19 ENCOUNTER — Other Ambulatory Visit: Payer: Self-pay

## 2018-01-19 DIAGNOSIS — R45851 Suicidal ideations: Secondary | ICD-10-CM | POA: Insufficient documentation

## 2018-01-19 DIAGNOSIS — F1010 Alcohol abuse, uncomplicated: Secondary | ICD-10-CM

## 2018-01-19 DIAGNOSIS — F329 Major depressive disorder, single episode, unspecified: Secondary | ICD-10-CM | POA: Insufficient documentation

## 2018-01-19 DIAGNOSIS — F1310 Sedative, hypnotic or anxiolytic abuse, uncomplicated: Secondary | ICD-10-CM

## 2018-01-19 DIAGNOSIS — F321 Major depressive disorder, single episode, moderate: Secondary | ICD-10-CM

## 2018-01-19 DIAGNOSIS — F17210 Nicotine dependence, cigarettes, uncomplicated: Secondary | ICD-10-CM | POA: Insufficient documentation

## 2018-01-19 DIAGNOSIS — F1994 Other psychoactive substance use, unspecified with psychoactive substance-induced mood disorder: Secondary | ICD-10-CM

## 2018-01-19 LAB — COMPREHENSIVE METABOLIC PANEL
ALBUMIN: 4.2 g/dL (ref 3.5–5.0)
ALK PHOS: 35 U/L — AB (ref 38–126)
ALT: 20 U/L (ref 14–54)
ANION GAP: 8 (ref 5–15)
AST: 32 U/L (ref 15–41)
BUN: 10 mg/dL (ref 6–20)
CALCIUM: 8.8 mg/dL — AB (ref 8.9–10.3)
CO2: 23 mmol/L (ref 22–32)
Chloride: 108 mmol/L (ref 101–111)
Creatinine, Ser: 0.55 mg/dL (ref 0.44–1.00)
GFR calc non Af Amer: >60 mL/min (ref >60)
Glucose, Bld: 92 mg/dL (ref 65–99)
Potassium: 4 mmol/L (ref 3.5–5.1)
SODIUM: 139 mmol/L (ref 135–145)
TOTAL PROTEIN: 7.5 g/dL (ref 6.5–8.1)
Total Bilirubin: 0.9 mg/dL (ref 0.3–1.2)

## 2018-01-19 LAB — URINALYSIS, COMPLETE (UACMP) WITH MICROSCOPIC
Bilirubin Urine: NEGATIVE
GLUCOSE, UA: NEGATIVE mg/dL
Ketones, ur: NEGATIVE mg/dL
Leukocytes, UA: NEGATIVE
Nitrite: POSITIVE — AB
PH: 6 (ref 5.0–8.0)
Protein, ur: NEGATIVE mg/dL
Specific Gravity, Urine: 1.006 (ref 1.005–1.030)

## 2018-01-19 LAB — URINE DRUG SCREEN, QUALITATIVE (ARMC ONLY)
AMPHETAMINES, UR SCREEN: NOT DETECTED
Barbiturates, Ur Screen: NOT DETECTED
Benzodiazepine, Ur Scrn: POSITIVE — AB
Cannabinoid 50 Ng, Ur ~~LOC~~: POSITIVE — AB
Cocaine Metabolite,Ur ~~LOC~~: NOT DETECTED
MDMA (ECSTASY) UR SCREEN: NOT DETECTED
METHADONE SCREEN, URINE: NOT DETECTED
OPIATE, UR SCREEN: NOT DETECTED
PHENCYCLIDINE (PCP) UR S: NOT DETECTED
Tricyclic, Ur Screen: NOT DETECTED

## 2018-01-19 LAB — CBC
HCT: 34.3 % — ABNORMAL LOW (ref 35.0–47.0)
HEMOGLOBIN: 11.8 g/dL — AB (ref 12.0–16.0)
MCH: 32.3 pg (ref 26.0–34.0)
MCHC: 34.3 g/dL (ref 32.0–36.0)
MCV: 94.1 fL (ref 80.0–100.0)
Platelets: 273 10*3/uL (ref 150–440)
RBC: 3.65 MIL/uL — AB (ref 3.80–5.20)
RDW: 16 % — ABNORMAL HIGH (ref 11.5–14.5)
WBC: 4.4 10*3/uL (ref 3.6–11.0)

## 2018-01-19 LAB — ETHANOL: ALCOHOL ETHYL (B): 155 mg/dL — AB (ref <10)

## 2018-01-19 LAB — ACETAMINOPHEN LEVEL

## 2018-01-19 LAB — PREGNANCY, URINE: PREG TEST UR: NEGATIVE

## 2018-01-19 LAB — SALICYLATE LEVEL: Salicylate Lvl: <7 mg/dL (ref 2.8–30.0)

## 2018-01-19 NOTE — Discharge Instructions (Signed)
Please return here for any further problems. Please follow-up with [facility]. The psychiatrist did not think you needed any medicines at the present time.

## 2018-01-19 NOTE — BH Assessment (Signed)
Writer unable to complete TTS consult due to patient unable to participate in interview. Writer made attempts to wake patient up but she did not respond.

## 2018-01-19 NOTE — ED Notes (Signed)
Per pts friend that is in ED WR: pt was last spoke with last night and reported that she was coming to ED to check in for mental health evaluation. Pts friend came to ED this AM to find that pt was still in car with heat on and prescription of xanax in car. Unknown amount missing/taken by pt. Count of 60 with 3 1/2 left in bottle. Pt admits to ETOH as well. Pt is ambulatory with uneven, unsteady gait. Pts eyes are blood shot and pt has slurred speech.

## 2018-01-19 NOTE — ED Notes (Signed)
Pt's medication that was brought with patient per RN Inetta Fermo has been discarded with witness Chief Executive Officer in sink.

## 2018-01-19 NOTE — ED Notes (Signed)
First Nurse Note:  Patient taken from registration desk to Triage Room 1, Rosey Bath RN came to Triage to escort patient to Room 25.

## 2018-01-19 NOTE — ED Notes (Signed)
Per dr. Darnelle Catalan, pt does not need a 1:1 sitter and can be monitored by leaving the door open at all times.  Pt resting in bed at this time.

## 2018-01-19 NOTE — ED Notes (Signed)
Pt sleeping. 

## 2018-01-19 NOTE — ED Triage Notes (Signed)
Patient experienced the recent death of a cousin.  States she feels SI and HI at times.  Alert and oriented.  Has been drinking ETOH.  Here for [facility].  States she used to go to [employer].  "This right here is voluntary for me".

## 2018-01-19 NOTE — Consult Note (Signed)
[facility] Consult   Reason for Consult: Consult for 27 year old woman who came to the emergency room after being found passed out intoxicated in the parking lot Referring Physician: Rip Harbour Patient Identification: Patricia Allison MRN:  [id number] Principal Diagnosis: Substance induced mood disorder [facility]) Diagnosis:   Patient Active Problem List   Diagnosis Date Noted  . Substance induced mood disorder ([place]) [F19.94] 01/19/2018  . Alcohol abuse [F10.10] 01/19/2018  . Benzodiazepine abuse ([place]) [F13.10] 01/19/2018  . Goiter [E04.9] 10/17/2013  . Anemia [D64.9] 10/05/2013  . ADHD (attention deficit hyperactivity disorder) [F90.9] 10/05/2013  . Acne [L70.9] 10/05/2013  . History of depression [Z86.59] 10/05/2013  . Loss of weight [R63.4] 10/05/2013  . Routine general medical examination at a health care facility [Z00.00] 10/05/2013  . Smoker [F17.200] 10/05/2013  . Marijuana use [F12.90] 10/05/2013    Total Time spent with patient: 1 hour  Subjective:   Patricia Allison is a 27 y.o. female patient admitted with "I just needed some rest".  HPI: Patient interviewed chart reviewed.  27 year old woman who evidently was found asleep in her car in the parking lot of the hospital.  Patient reports that she came over here to the hospital last night hoping to talk to somebody about depression but apparently fell asleep in the car before she could get inside.  She says she had about 8 margaritas last night which is more than her usual amount and also took some Xanax which does not belong to her.  Patient says she has been feeling stressed out and anxious.  Her girlfriend is a big part of it and some problems with her family relations.  However she says she is not depressed constantly every day.  Most night sleep is okay mainly because she takes the Xanax.  No physical complaints.  Patient denies any suicidal or homicidal thoughts at all right now.  Denies any kind of psychotic  symptoms.  She is not currently receiving any outpatient mental health treatment or on any prescription medicine of her own.  Social history: Works at [employer] in [place].  Lives with her girlfriend.  Somewhat estranged relations with her family back in [place].  Medical history: Denies any ongoing medical problems  Substance abuse history: Drinks almost every day sometimes binges last night was more than usual.  No history of seizures or DTs.  Past Psychiatric History: Patient used to be seen in [place] for mental health treatment quite a bit as an adolescent.  Had a suicide attempt when she was a teenager.  Says that she has been off of all medicine and not getting any mental health treatment in several years now.  Risk to Self: Is patient at risk for suicide?: Yes Risk to Others:   Prior Inpatient Therapy:   Prior Outpatient Therapy:    Past Medical History:  Past Medical History:  Diagnosis Date  . Depression   . Eating disorder   . Fainting spell    No past surgical history on file. Family History:  Family History  Problem Relation Age of Onset  . Alcohol abuse Maternal Uncle    Family Psychiatric  History: She says several people in her family have substance abuse problems Social History:  Social History   Substance and Sexual Activity  Alcohol Use Yes   Comment: weekends     Social History   Substance and Sexual Activity  Drug Use Yes  . Types: Marijuana    Social History   Socioeconomic  History  . Marital status: Single    Spouse name: Not on file  . Number of children: Not on file  . Years of education: Not on file  . Highest education level: Not on file  Occupational History  . Not on file  Social Needs  . Financial resource strain: Not on file  . Food insecurity:    Worry: Not on file    Inability: Not on file  . Transportation needs:    Medical: Not on file    Non-medical: Not on file  Tobacco Use  . Smoking status: Current  Every Day Smoker    Packs/day: 0.30    Types: Cigarettes  . Smokeless tobacco: Never Used  Substance and Sexual Activity  . Alcohol use: Yes    Comment: weekends  . Drug use: Yes    Types: Marijuana  . Sexual activity: Not on file  Lifestyle  . Physical activity:    Days per week: Not on file    Minutes per session: Not on file  . Stress: Not on file  Relationships  . Social connections:    Talks on phone: Not on file    Gets together: Not on file    Attends religious service: Not on file    Active member of club or organization: Not on file    Attends meetings of clubs or organizations: Not on file    Relationship status: Not on file  Other Topics Concern  . Not on file  Social History Narrative  . Not on file   Additional Social History:    Allergies:  No Known Allergies  Labs:  Results for orders placed or performed during the hospital encounter of 01/19/18 (from the past 48 hour(s))  Comprehensive metabolic panel     Status: Abnormal   Collection Time: 01/19/18  7:39 AM  Result Value Ref Range   Sodium 139 135 - 145 mmol/L   Potassium 4.0 3.5 - 5.1 mmol/L   Chloride 108 101 - 111 mmol/L   CO2 23 22 - 32 mmol/L   Glucose, Bld 92 65 - 99 mg/dL   BUN 10 6 - 20 mg/dL   Creatinine, Ser 0.55 0.44 - 1.00 mg/dL   Calcium 8.8 (L) 8.9 - 10.3 mg/dL   Total Protein 7.5 6.5 - 8.1 g/dL   Albumin 4.2 3.5 - 5.0 g/dL   AST 32 15 - 41 U/L   ALT 20 14 - 54 U/L   Alkaline Phosphatase 35 (L) 38 - 126 U/L   Total Bilirubin 0.9 0.3 - 1.2 mg/dL   GFR calc non Af Amer >60 >60 mL/min   GFR calc Af Amer >60 >60 mL/min    Comment: (NOTE) The eGFR has been calculated using the CKD EPI equation. This calculation has not been validated in all clinical situations. eGFR's persistently <60 mL/min signify possible Chronic Kidney Disease.    Anion gap 8 5 - 15    Comment: Performed at [facility], [address] [place], [place] [address]  CBC     Status: Abnormal    Collection Time: 01/19/18  7:39 AM  Result Value Ref Range   WBC 4.4 3.6 - 11.0 K/uL   RBC 3.65 (L) 3.80 - 5.20 MIL/uL   Hemoglobin 11.8 (L) 12.0 - 16.0 g/dL   HCT 34.3 (L) 35.0 - 47.0 %   MCV 94.1 80.0 - 100.0 fL   MCH 32.3 26.0 - 34.0 pg   MCHC 34.3 32.0 - 36.0 g/dL  RDW 16.0 (H) 11.5 - 14.5 %   Platelets 273 150 - 440 K/uL    Comment: Performed at [facility], [address] [place], [place] [address]  Acetaminophen level     Status: Abnormal   Collection Time: 01/19/18  7:39 AM  Result Value Ref Range   Acetaminophen (Tylenol), Serum <10 (L) 10 - 30 ug/mL    Comment: (NOTE) Therapeutic concentrations vary significantly. A range of 10-30 ug/mL  may be an effective concentration for many patients. However, some  are best treated at concentrations outside of this range. Acetaminophen concentrations >150 ug/mL at 4 hours after ingestion  and >50 ug/mL at 12 hours after ingestion are often associated with  toxic reactions. Performed at [facility], [address] [place], [place] [address]   Ethanol     Status: Abnormal   Collection Time: 01/19/18  7:39 AM  Result Value Ref Range   Alcohol, Ethyl (B) 155 (H) <10 mg/dL    Comment: (NOTE) Lowest detectable limit for serum alcohol is 10 mg/dL. For medical purposes only. Performed at [facility], [address] [place], [place] [address]   Salicylate level     Status: None   Collection Time: 01/19/18  7:39 AM  Result Value Ref Range   Salicylate Lvl <1.6 2.8 - 30.0 mg/dL    Comment: Performed at [facility], [address] [place], [place] [address]  Pregnancy, urine     Status: None   Collection Time: 01/19/18  7:59 AM  Result Value Ref Range   Preg Test, Ur NEGATIVE NEGATIVE    Comment: Performed at [facility], [address] [place], [place] [address]  Urinalysis, Complete w Microscopic     Status: Abnormal   Collection Time: 01/19/18  7:59 AM  Result Value  Ref Range   Color, Urine YELLOW (A) YELLOW   APPearance CLEAR (A) CLEAR   Specific Gravity, Urine 1.006 1.005 - 1.030   pH 6.0 5.0 - 8.0   Glucose, UA NEGATIVE NEGATIVE mg/dL   Hgb urine dipstick LARGE (A) NEGATIVE   Bilirubin Urine NEGATIVE NEGATIVE   Ketones, ur NEGATIVE NEGATIVE mg/dL   Protein, ur NEGATIVE NEGATIVE mg/dL   Nitrite POSITIVE (A) NEGATIVE   Leukocytes, UA NEGATIVE NEGATIVE   RBC / HPF 0-5 0 - 5 RBC/hpf   WBC, UA 0-5 0 - 5 WBC/hpf   Bacteria, UA RARE (A) NONE SEEN   Squamous Epithelial / LPF 0-5 0 - 5   Mucus PRESENT     Comment: Performed at [facility], [address], [place], [place] [address]  Urine Drug Screen, Qualitative     Status: Abnormal   Collection Time: 01/19/18  7:59 AM  Result Value Ref Range   Tricyclic, Ur Screen NONE DETECTED NONE DETECTED   Amphetamines, Ur Screen NONE DETECTED NONE DETECTED   MDMA (Ecstasy)Ur Screen NONE DETECTED NONE DETECTED   Cocaine Metabolite,Ur [place] NONE DETECTED NONE DETECTED   Opiate, Ur Screen NONE DETECTED NONE DETECTED   Phencyclidine (PCP) Ur S NONE DETECTED NONE DETECTED   Cannabinoid 50 Ng, Ur [place] POSITIVE (A) NONE DETECTED   Barbiturates, Ur Screen NONE DETECTED NONE DETECTED   Benzodiazepine, Ur Scrn POSITIVE (A) NONE DETECTED   Methadone Scn, Ur NONE DETECTED NONE DETECTED    Comment: (NOTE) Tricyclics + metabolites, urine    Cutoff 1000 ng/mL Amphetamines + metabolites, urine  Cutoff 1000 ng/mL MDMA (Ecstasy), urine  Cutoff 500 ng/mL Cocaine Metabolite, urine          Cutoff 300 ng/mL Opiate + metabolites, urine        Cutoff 300 ng/mL Phencyclidine (PCP), urine         Cutoff 25 ng/mL Cannabinoid, urine                 Cutoff 50 ng/mL Barbiturates + metabolites, urine  Cutoff 200 ng/mL Benzodiazepine, urine              Cutoff 200 ng/mL Methadone, urine                   Cutoff 300 ng/mL The urine drug screen provides only a preliminary, unconfirmed analytical test result and  should not be used for non-medical purposes. Clinical consideration and professional judgment should be applied to any positive drug screen result due to possible interfering substances. A more specific alternate chemical method must be used in order to obtain a confirmed analytical result. Gas chromatography / mass spectrometry (GC/MS) is the preferred confirmat ory method. Performed at [facility], [address] [place], [place] [address]     No current facility-administered medications for this encounter.    Current Outpatient Medications  Medication Sig Dispense Refill  . clindamycin-benzoyl peroxide (BENZACLIN) gel Apply topically 2 (two) times daily. To affected areas for acne 50 g 1  . iron polysaccharides (NU-IRON) 150 MG capsule Take 1 capsule (150 mg total) by mouth 2 (two) times daily. 60 capsule 5  . naproxen (NAPROSYN) 500 MG tablet Take 1 tablet (500 mg total) by mouth 2 (two) times daily with a meal. 30 tablet 0    Musculoskeletal: Strength & Muscle Tone: within normal limits Gait & Station: normal Patient leans: N/A  Psychiatric Specialty Exam: Physical Exam  Nursing note and vitals reviewed. Constitutional: She appears well-developed and well-nourished.  HENT:  Head: Normocephalic and atraumatic.  Eyes: Pupils are equal, round, and reactive to light. Conjunctivae are normal.  Neck: Normal range of motion.  Cardiovascular: Regular rhythm and normal heart sounds.  Respiratory: Effort normal. No respiratory distress.  GI: Soft.  Musculoskeletal: Normal range of motion.  Neurological: She is alert.  Skin: Skin is warm and dry.  Psychiatric: Her affect is blunt. Her speech is delayed. She is slowed. Thought content is not paranoid. Cognition and memory are impaired. She expresses impulsivity. She expresses no homicidal and no suicidal ideation. She exhibits abnormal recent memory.    Review of Systems  Constitutional: Negative.   HENT: Negative.    Eyes: Negative.   Respiratory: Negative.   Cardiovascular: Negative.   Gastrointestinal: Negative.   Musculoskeletal: Negative.   Skin: Negative.   Neurological: Negative.   Psychiatric/Behavioral: Positive for substance abuse. Negative for depression, hallucinations, memory loss and suicidal ideas. The patient is not nervous/anxious and does not have insomnia.     Blood pressure 116/67, pulse (!) 101, temperature 98 F (36.7 C), temperature source Oral, resp. rate 20, height 5' 5" (1.651 m), weight 64 kg (141 lb), last menstrual period 01/19/2018, SpO2 100 %.Body mass index is 23.46 kg/m.  General Appearance: Casual  Eye Contact:  Fair  Speech:  Garbled and Slow  Volume:  Decreased  Mood:  Euthymic  Affect:  Constricted  Thought Process:  Goal Directed  Orientation:  Full (Time, Place, and Person)  Thought Content:  Logical and Rumination  Suicidal Thoughts:  No  Homicidal Thoughts:  No  Memory:  Immediate;   Fair  Recent;   Poor Remote;   Fair  Judgement:  Impaired  Insight:  Shallow  Psychomotor Activity:  Decreased  Concentration:  Concentration: Fair  Recall:  Poor  Fund of Knowledge:  Fair  Language:  Fair  Akathisia:  No  Handed:  Right  AIMS (if indicated):     Assets:  Housing Physical Health  ADL's:  Intact  Cognition:  WNL  Sleep:        Treatment Plan Summary: Plan 27 year old woman abusing alcohol and benzodiazepines.  Denies any suicidal thoughts.  Affect is basically euthymic.  Not psychotic.  No sign of acute dangerousness.  Patient does not require inpatient hospitalization and does not meet commitment criteria.  Psychoeducation completed about the dangerousness of driving while drinking and also the combination of alcohol and benzodiazepines.  Strongly encourage patient to consider getting back into therapy at least and go to [facility] for treatment.  No need however for new prescriptions.  Case reviewed with emergency room doctor and TTS.  Disposition: No  evidence of imminent risk to self or others at present.   Patient does not meet criteria for psychiatric inpatient admission. Supportive therapy provided about ongoing stressors. Discussed crisis plan, support from social network, calling 911, coming to the Emergency Department, and calling Suicide Hotline.  Alethia Berthold, MD 01/19/2018 1:01 PM

## 2018-01-19 NOTE — ED Notes (Signed)
Pt offered meal tray. Pt states she is not hungry at this time. Pt advised to let RN know when she needs anything

## 2018-01-19 NOTE — ED Notes (Signed)
Triage incomplete, Lona Kettle and Inetta Fermo RN aware.

## 2018-01-19 NOTE — BH Assessment (Signed)
Writer was unable to complete TTS Consult due to patient discharged prior to been seen.

## 2018-01-19 NOTE — ED Provider Notes (Signed)
[facility] Emergency Department Provider Note   ____________________________________________   First MD Initiated Contact with Patient 01/19/18 [phone]     (approximate)  I have reviewed the triage vital signs and the nursing notes.   HISTORY  Chief Complaint Altered Mental Status    HPI Patricia Allison is a 27 y.o. female Who was going to come in to the ER last night to see Deanna Artis. for mental health visit. Patient's friend came to the year this morning and found the patient was in the car the heat on a bottle of Xanax was not hers and the car prescription bottles for 16 only 3 and half pill bottles left. Patient told the nurse she was thinking of suicide she denied this to me that she hurt someone else first. She has been drinking as well. She wanted to a voluntary admission to [facility] apparently.   Past Medical History:  Diagnosis Date  . Depression   . Eating disorder   . Fainting spell     Patient Active Problem List   Diagnosis Date Noted  . Substance induced mood disorder ([facility]) 01/19/2018  . Alcohol abuse 01/19/2018  . Benzodiazepine abuse ([facility]) 01/19/2018  . Goiter 10/17/2013  . Anemia 10/05/2013  . ADHD (attention deficit hyperactivity disorder) 10/05/2013  . Acne 10/05/2013  . History of depression 10/05/2013  . Loss of weight 10/05/2013  . Routine general medical examination at a health care facility 10/05/2013  . Smoker 10/05/2013  . Marijuana use 10/05/2013    No past surgical history on file.  Prior to Admission medications   Medication Sig Start Date End Date Taking? Authorizing Provider  clindamycin-benzoyl peroxide (BENZACLIN) gel Apply topically 2 (two) times daily. To affected areas for acne 10/05/13   Tower, Audrie Gallus, MD  iron polysaccharides (NU-IRON) 150 MG capsule Take 1 capsule (150 mg total) by mouth 2 (two) times daily. 10/21/13   Tower, Audrie Gallus, MD  naproxen (NAPROSYN) 500 MG tablet Take 1 tablet (500 mg total) by  mouth 2 (two) times daily with a meal. 10/27/17   Tommi Rumps, PA-C    Allergies Patient has no known allergies.  Family History  Problem Relation Age of Onset  . Alcohol abuse Maternal Uncle     Social History Social History   Tobacco Use  . Smoking status: Current Every Day Smoker    Packs/day: 0.30    Types: Cigarettes  . Smokeless tobacco: Never Used  Substance Use Topics  . Alcohol use: Yes    Comment: weekends  . Drug use: Yes    Types: Marijuana    Review of Systems  Constitutional: No fever/chills Eyes: No visual changes. ENT: No sore throat. Cardiovascular: Denies chest pain. Respiratory: Denies shortness of breath. Gastrointestinal: No abdominal pain.  No nausea, no vomiting.  No diarrhea.  No constipation. Genitourinary: Negative for dysuria. Musculoskeletal: Negative for back pain. Skin: Negative for rash. Neurological: Negative for headaches, focal weakness  ____________________________________________   PHYSICAL EXAM:  VITAL SIGNS: ED Triage Vitals  Enc Vitals Group     BP 01/19/18 0735 116/67     Pulse Rate 01/19/18 0735 (!) 101     Resp 01/19/18 0735 20     Temp 01/19/18 0735 98 F (36.7 C)     Temp Source 01/19/18 0735 Oral     SpO2 01/19/18 0735 100 %     Weight 01/19/18 0736 141 lb (64 kg)     Height 01/19/18 0736  (1.651  m)     Head Circumference --      Peak Flow --      Pain Score 01/19/18 0736 0     Pain Loc --      Pain Edu? --      Excl. in GC? --     Constitutional: Alert and oriented. Well appearing and in no acute distress. Eyes: Conjunctivae are normal.  Head: Atraumatic. Nose: No congestion/rhinnorhea. Mouth/Throat: Mucous membranes are moist.  Oropharynx non-erythematous. Neck: No stridor.  Cardiovascular: Normal rate, regular rhythm. Grossly normal heart sounds.  Good peripheral circulation. Respiratory: Normal respiratory effort.  No retractions. Lungs CTAB. Gastrointestinal: Soft and nontender. No  distention. No abdominal bruits. No CVA tenderness. Musculoskeletal: No lower extremity tenderness nor edema.  No joint effusions. Neurologic:  Normal speech and language. No gross focal neurologic deficits are appreciated. No gait instability. Skin:  Skin is warm, dry and intact. No rash noted.   ____________________________________________   LABS (all labs ordered are listed, but only abnormal results are displayed)  Labs Reviewed  COMPREHENSIVE METABOLIC PANEL - Abnormal; Notable for the following components:      Result Value   Calcium 8.8 (*)    Alkaline Phosphatase 35 (*)    All other components within normal limits  CBC - Abnormal; Notable for the following components:   RBC 3.65 (*)    Hemoglobin 11.8 (*)    HCT 34.3 (*)    RDW 16.0 (*)    All other components within normal limits  ACETAMINOPHEN LEVEL - Abnormal; Notable for the following components:   Acetaminophen (Tylenol), Serum <10 (*)    All other components within normal limits  ETHANOL - Abnormal; Notable for the following components:   Alcohol, Ethyl (B) 155 (*)    All other components within normal limits  URINALYSIS, COMPLETE (UACMP) WITH MICROSCOPIC - Abnormal; Notable for the following components:   Color, Urine YELLOW (*)    APPearance CLEAR (*)    Hgb urine dipstick LARGE (*)    Nitrite POSITIVE (*)    Bacteria, UA RARE (*)    All other components within normal limits  URINE DRUG SCREEN, QUALITATIVE ([facility] ONLY) - Abnormal; Notable for the following components:   Cannabinoid 50 Ng, Ur [place] POSITIVE (*)    Benzodiazepine, Ur Scrn POSITIVE (*)    All other components within normal limits  SALICYLATE LEVEL  PREGNANCY, URINE  CBG MONITORING, ED  POC URINE PREG, ED   ____________________________________________  EKG   ____________________________________________  RADIOLOGY  ED MD interpretation:    Official radiology report(s): No results  found.  ____________________________________________   PROCEDURES  Procedure(s) performed:   Procedures  Critical Care performed:   ____________________________________________   INITIAL IMPRESSION / ASSESSMENT AND PLAN / ED COURSE Dr. Mat Carne packs a psychiatrist to seeing the patient. He does not feel she meets criteria for inpatient admission. He does not want to give her any medicines at this point. I will have her follow-up with [facility] return here if needed.     Clinical Course as of Jan 20 1324  Tue Jan 19, 2018  1610 Cannabinoid 50 Ng, Ur [place](!): POSITIVE [PM]    Clinical Course User Index [PM] Arnaldo Natal, MD     ____________________________________________   FINAL CLINICAL IMPRESSION(S) / ED DIAGNOSES  Final diagnoses:  Current moderate episode of major depressive disorder, unspecified whether recurrent [facility])     ED Discharge Orders    None       Note:  This document was prepared using Dragon voice recognition software and may include unintentional dictation errors.    Arnaldo Natal, MD 01/19/18 1504

## 2018-01-19 NOTE — ED Notes (Signed)
Pt to ed with family who reports that pt went missing last night. Pt apparently had been asleep in her car in the parking lot at the hospital.  Reports she was coming here for a behavioral evaluation but fell asleep in her car ad did not come inside last night.  Pt states she was feeling suicidal last night and also had thoughts of "taking people with me".  However, pt states now she simply is "drunk, and I have no thoughts of hurting myself or others.  I only have happy thoughts."  Pt denies pain at this time, reports she feels sleepy and tired and wants to rest.  Pt dressed in purple scrubs, all harmful objects removed from room.  Per Dr. Darnelle Catalan pt does not need a 1:1 sitter for suicide or safety.  Pt is cooperative and calm.

## 2018-01-19 NOTE — BH Assessment (Signed)
Contacted pt's nurse, Rosey Bath RN, to determine if TTS Assessment can be completed at this time. Rosey Bath states pt is still sound asleep. An attempt to complete pt's assessment will be made at a later time.

## 2018-01-19 NOTE — ED Notes (Signed)
Pt Mother: Carson Myrtle [phone], pt father, Elson Clan [phone].

## 2018-07-09 ENCOUNTER — Emergency Department
Admission: EM | Admit: 2018-07-09 | Discharge: 2018-07-10 | Disposition: A | Discharge status: Home / Self Care | Payer: Self-pay | Attending: Emergency Medicine | Admitting: Emergency Medicine

## 2018-07-09 ENCOUNTER — Encounter: Payer: Self-pay | Admitting: Emergency Medicine

## 2018-07-09 ENCOUNTER — Emergency Department: Payer: Self-pay

## 2018-07-09 ENCOUNTER — Other Ambulatory Visit: Payer: Self-pay

## 2018-07-09 DIAGNOSIS — M7551 Bursitis of right shoulder: Secondary | ICD-10-CM | POA: Insufficient documentation

## 2018-07-09 DIAGNOSIS — Z79899 Other long term (current) drug therapy: Secondary | ICD-10-CM | POA: Insufficient documentation

## 2018-07-09 DIAGNOSIS — F17210 Nicotine dependence, cigarettes, uncomplicated: Secondary | ICD-10-CM | POA: Insufficient documentation

## 2018-07-09 LAB — BASIC METABOLIC PANEL
Anion gap: 10 (ref 5–15)
BUN: 13 mg/dL (ref 6–20)
CALCIUM: 9.2 mg/dL (ref 8.9–10.3)
CHLORIDE: 104 mmol/L (ref 98–111)
CO2: 21 mmol/L — ABNORMAL LOW (ref 22–32)
CREATININE: 0.56 mg/dL (ref 0.44–1.00)
GFR calc Af Amer: >60 mL/min (ref >60)
GFR calc non Af Amer: >60 mL/min (ref >60)
Glucose, Bld: 95 mg/dL (ref 70–99)
Potassium: 3.6 mmol/L (ref 3.5–5.1)
SODIUM: 135 mmol/L (ref 135–145)

## 2018-07-09 LAB — CBC
HEMATOCRIT: 36.7 % (ref 36.0–46.0)
Hemoglobin: 11.8 g/dL — ABNORMAL LOW (ref 12.0–15.0)
MCH: 31 pg (ref 26.0–34.0)
MCHC: 32.2 g/dL (ref 30.0–36.0)
MCV: 96.3 fL (ref 80.0–100.0)
Platelets: 280 10*3/uL (ref 150–400)
RBC: 3.81 MIL/uL — ABNORMAL LOW (ref 3.87–5.11)
RDW: 14.1 % (ref 11.5–15.5)
WBC: 8.6 10*3/uL (ref 4.0–10.5)
nRBC: 0 % (ref 0.0–0.2)

## 2018-07-09 LAB — TROPONIN I: Troponin I: <0.03 ng/mL (ref <0.03)

## 2018-07-09 MED ORDER — ASPIRIN 81 MG PO CHEW: 324.0000 mg | CHEWABLE_TABLET | Freq: Once | ORAL | Status: DC

## 2018-07-09 NOTE — ED Provider Notes (Signed)
[facility] Emergency Department Provider Note   ____________________________________________   First MD Initiated Contact with Patient 07/09/18 2355     (approximate)  I have reviewed the triage vital signs and the nursing notes.   HISTORY  Chief Complaint Shoulder Pain; Back Pain; and Chest Pain    HPI Patricia Allison is a 27 y.o. female who presents to the ED from home with a chief complaint of nontraumatic right shoulder blade and right chest pain.  She reports symptoms x2 days.  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  Painful to move.  Presents tonight because pain has now radiated into her right chest and it is painful for her to take a deep breath.  Denies associated fever, chills, cough, congestion, shortness of breath, abdominal pain, nausea or vomiting.  Denies recent travel, trauma or OCP use.   Past Medical History:  Diagnosis Date  . Depression   . Eating disorder   . Fainting spell     Patient Active Problem List   Diagnosis Date Noted  . Substance induced mood disorder (HCC) 01/19/2018  . Alcohol abuse 01/19/2018  . Benzodiazepine abuse (HCC) 01/19/2018  . Goiter 10/17/2013  . Anemia 10/05/2013  . ADHD (attention deficit hyperactivity disorder) 10/05/2013  . Acne 10/05/2013  . History of depression 10/05/2013  . Loss of weight 10/05/2013  . Routine general medical examination at a health care facility 10/05/2013  . Smoker 10/05/2013  . Marijuana use 10/05/2013    History reviewed. No pertinent surgical history.  Prior to Admission medications   Medication Sig Start Date End Date Taking? Authorizing Provider  clindamycin-benzoyl peroxide (BENZACLIN) gel Apply topically 2 (two) times daily. To affected areas for acne 10/05/13   Tower, Audrie Gallus, MD  HYDROcodone-acetaminophen (NORCO) 5-325 MG tablet Take 1 tablet by mouth every 6 (six)  hours as needed for moderate pain. 07/10/18   Irean Hong, MD  ibuprofen (ADVIL,MOTRIN) 600 MG tablet Take 1 tablet (600 mg total) by mouth every 8 (eight) hours as needed. 07/10/18   Irean Hong, MD  iron polysaccharides (NU-IRON) 150 MG capsule Take 1 capsule (150 mg total) by mouth 2 (two) times daily. 10/21/13   Tower, Audrie Gallus, MD  naproxen (NAPROSYN) 500 MG tablet Take 1 tablet (500 mg total) by mouth 2 (two) times daily with a meal. 10/27/17   Tommi Rumps, PA-C    Allergies Patient has no known allergies.  Family History  Problem Relation Age of Onset  . Alcohol abuse Maternal Uncle     Social History Social History   Tobacco Use  . Smoking status: Current Every Day Smoker    Packs/day: 0.30    Types: Cigarettes  . Smokeless tobacco: Never Used  Substance Use Topics  . Alcohol use: Yes    Comment: weekends  . Drug use: Yes    Types: Marijuana    Review of Systems  Constitutional: No fever/chills Eyes: No visual changes. ENT: No sore throat. Cardiovascular: Positive for chest pain. Respiratory: Denies shortness of breath. Gastrointestinal: No abdominal pain.  No nausea, no vomiting.  No diarrhea.  No constipation. Genitourinary: Negative for dysuria. Musculoskeletal: Positive for right shoulder blade pain.  Negative for back pain. Skin: Negative for rash. Neurological: Negative for headaches, focal weakness or numbness.   ____________________________________________   PHYSICAL EXAM:  VITAL SIGNS: ED Triage Vitals  Enc  Vitals Group     BP 07/09/18 2312 (!) 127/94     Pulse Rate 07/09/18 2312 93     Resp 07/09/18 2312 20     Temp 07/09/18 2312 98.2 F (36.8 C)     Temp Source 07/09/18 2312 Oral     SpO2 07/09/18 2312 99 %     Weight 07/09/18 2313 135 lb (61.2 kg)     Height 07/09/18 2313 5\' 5"  (1.651 m)     Head Circumference --      Peak Flow --      Pain Score 07/09/18 2312 7     Pain Loc --      Pain Edu? --      Excl. in GC? --      Constitutional: Alert and oriented. Well appearing and in no acute distress. Eyes: Conjunctivae are normal. PERRL. EOMI. Head: Atraumatic. Nose: No congestion/rhinnorhea. Mouth/Throat: Mucous membranes are moist.  Oropharynx non-erythematous. Neck: No stridor.  No cervical spine tenderness to palpation. Cardiovascular: Normal rate, regular rhythm. Grossly normal heart sounds.  Good peripheral circulation. Respiratory: Normal respiratory effort.  No retractions. Lungs CTAB.  Right shoulder blade painful on movement of trunk. Gastrointestinal: Soft and nontender. No distention. No abdominal bruits. No CVA tenderness. Musculoskeletal: Right shoulder blade tender to palpation and with movement of shoulder.  2+ radial pulses.  Brisk, less than 5-second capillary refill.  5/5 motor strength and sensation.  No lower extremity tenderness nor edema.  No joint effusions. Neurologic:  Normal speech and language. No gross focal neurologic deficits are appreciated. No gait instability. Skin:  Skin is warm, dry and intact. No rash noted. Psychiatric: Mood and affect are normal. Speech and behavior are normal.  ____________________________________________   LABS (all labs ordered are listed, but only abnormal results are displayed)  Labs Reviewed  BASIC METABOLIC PANEL - Abnormal; Notable for the following components:      Result Value   CO2 21 (*)    All other components within normal limits  CBC - Abnormal; Notable for the following components:   RBC 3.81 (*)    Hemoglobin 11.8 (*)    All other components within normal limits  TROPONIN I  FIBRIN DERIVATIVES D-DIMER ([facility] ONLY)   ____________________________________________  EKG  ED ECG REPORT I, SUNG,JADE J, the attending physician, personally viewed and interpreted this ECG.   Date: 07/10/2018  EKG Time: 2318  Rate: 92  Rhythm: normal EKG, normal sinus rhythm  Axis: Normal  Intervals:none  ST&T Change:  Nonspecific  ____________________________________________  RADIOLOGY  ED MD interpretation: No acute cardiopulmonary process  Official radiology report(s): Dg Chest 2 View  Result Date: 07/09/2018 CLINICAL DATA:  Chest pain EXAM: CHEST - 2 VIEW COMPARISON:  None. FINDINGS: The heart size and mediastinal contours are within normal limits. Both lungs are clear. The visualized skeletal structures are unremarkable. IMPRESSION: No active cardiopulmonary disease. Electronically Signed   By: Jasmine Pang M.D.   On: 07/09/2018 23:49    ____________________________________________   PROCEDURES  Procedure(s) performed: None  Procedures  Critical Care performed: No  ____________________________________________   INITIAL IMPRESSION / ASSESSMENT AND PLAN / ED COURSE  As part of my medical decision making, I reviewed the following data within the electronic MEDICAL RECORD NUMBER Nursing notes reviewed and incorporated, Labs reviewed, EKG interpreted, Old chart reviewed, Radiograph reviewed and Notes from prior ED visits   27 year old female who presents with nontraumatic right shoulder blade pain now radiating into her right chest with associated pain  on inspiration. Differential diagnosis includes, but is not limited to, ACS, aortic dissection, pulmonary embolism, cardiac tamponade, pneumothorax, pneumonia, pericarditis, myocarditis, GI-related causes including esophagitis/gastritis, and musculoskeletal chest wall pain.    Patient is low risk for PE.  Clinically feel symptoms are musculoskeletal.  However, will obtain cardiac panel including d-dimer.  Will administer 15 mg IV Toradol.  With oral Norco for pain.  Will reassess.  Clinical Course as of Jul 10 554  Sat Jul 10, 2018  1914 Patient feeling better after medications.  Updated her of all test results.  Will discharge home with shoulder sling, NSAIDs, analgesia and she will follow-up with orthopedics as needed.  Strict return  precautions given.  Patient and family members verbalized understanding and agrees with plan of care.   [JS]    Clinical Course User Index [JS] Irean Hong, MD     ____________________________________________   FINAL CLINICAL IMPRESSION(S) / ED DIAGNOSES  Final diagnoses:  Bursitis of right shoulder     ED Discharge Orders         Ordered    ibuprofen (ADVIL,MOTRIN) 600 MG tablet  Every 8 hours PRN     07/10/18 0101    HYDROcodone-acetaminophen (NORCO) 5-325 MG tablet  Every 6 hours PRN     07/10/18 0101           Note:  This document was prepared using Dragon voice recognition software and may include unintentional dictation errors.    Irean Hong, MD 07/10/18 [id number]

## 2018-07-09 NOTE — ED Triage Notes (Signed)
Pt reports for 2 days has been having right shoulder blade pain and back pain when trying to take a deep breath, reports at work has repetitive motion using arm denies any injuries. Pt denies any chest pain reports right side chest pain radiates to right side.  Pt talks in complete sentences no distress noted.

## 2018-07-10 LAB — FIBRIN DERIVATIVES D-DIMER (ARMC ONLY): Fibrin derivatives D-dimer (ARMC): 402.71 ng/mL (FEU) (ref 0.00–499.00)

## 2018-07-10 MED ORDER — HYDROCODONE-ACETAMINOPHEN 5-325 MG PO TABS: 1.0000 | ORAL_TABLET | Freq: Four times a day (QID) | ORAL | 0 refills | Status: DC | PRN

## 2018-07-10 MED ORDER — IBUPROFEN 600 MG PO TABS: 600.0000 mg | ORAL_TABLET | Freq: Three times a day (TID) | ORAL | 0 refills | Status: DC | PRN

## 2018-07-10 MED ORDER — HYDROCODONE-ACETAMINOPHEN 5-325 MG PO TABS
1.0000 | ORAL_TABLET | Freq: Once | ORAL | Status: AC
  Administered 2018-07-10: 1 via ORAL
  Filled 2018-07-10: qty 1

## 2018-07-10 MED ORDER — KETOROLAC TROMETHAMINE 30 MG/ML IJ SOLN
15.0000 mg | Freq: Once | INTRAMUSCULAR | Status: AC
  Administered 2018-07-10: 15 mg via INTRAVENOUS
  Filled 2018-07-10: qty 1

## 2018-07-10 NOTE — Discharge Instructions (Addendum)
1.  You may take pain medicines as needed (Motrin/Norco #15). 2.  Wear sling as needed for comfort. 3.  Return to the ER for worsening symptoms, persistent vomiting, difficulty breathing or other concerns. 

## 2018-11-10 ENCOUNTER — Encounter: Payer: Self-pay | Admitting: Emergency Medicine

## 2018-11-10 ENCOUNTER — Emergency Department
Admission: EM | Admit: 2018-11-10 | Discharge: 2018-11-10 | Disposition: A | Discharge status: Home / Self Care | Payer: Self-pay | Attending: Emergency Medicine | Admitting: Emergency Medicine

## 2018-11-10 ENCOUNTER — Other Ambulatory Visit: Payer: Self-pay

## 2018-11-10 ENCOUNTER — Emergency Department: Payer: Self-pay

## 2018-11-10 DIAGNOSIS — X58XXXA Exposure to other specified factors, initial encounter: Secondary | ICD-10-CM | POA: Insufficient documentation

## 2018-11-10 DIAGNOSIS — Y999 Unspecified external cause status: Secondary | ICD-10-CM | POA: Insufficient documentation

## 2018-11-10 DIAGNOSIS — M79605 Pain in left leg: Secondary | ICD-10-CM

## 2018-11-10 DIAGNOSIS — F909 Attention-deficit hyperactivity disorder, unspecified type: Secondary | ICD-10-CM | POA: Insufficient documentation

## 2018-11-10 DIAGNOSIS — Y939 Activity, unspecified: Secondary | ICD-10-CM | POA: Insufficient documentation

## 2018-11-10 DIAGNOSIS — Y929 Unspecified place or not applicable: Secondary | ICD-10-CM | POA: Insufficient documentation

## 2018-11-10 DIAGNOSIS — Z79899 Other long term (current) drug therapy: Secondary | ICD-10-CM | POA: Insufficient documentation

## 2018-11-10 DIAGNOSIS — S76319A Strain of muscle, fascia and tendon of the posterior muscle group at thigh level, unspecified thigh, initial encounter: Secondary | ICD-10-CM

## 2018-11-10 DIAGNOSIS — F17210 Nicotine dependence, cigarettes, uncomplicated: Secondary | ICD-10-CM | POA: Insufficient documentation

## 2018-11-10 NOTE — ED Provider Notes (Signed)
[facility] Emergency Department Provider Note  ____________________________________________   First MD Initiated Contact with Patient 11/10/18 1505     (approximate)  I have reviewed the triage vital signs and the nursing notes.   HISTORY  Chief Complaint Leg Pain    HPI Patricia Allison is a 28 y.o. female presents emergency department complaint of left leg pain since last Sunday.  She states she has a history of varicose veins but this is a little different.  She is a smoker.  No birth control use.  No known injury.    Past Medical History:  Diagnosis Date  . Depression   . Eating disorder   . Fainting spell     Patient Active Problem List   Diagnosis Date Noted  . Substance induced mood disorder (HCC) 01/19/2018  . Alcohol abuse 01/19/2018  . Benzodiazepine abuse (HCC) 01/19/2018  . Goiter 10/17/2013  . Anemia 10/05/2013  . ADHD (attention deficit hyperactivity disorder) 10/05/2013  . Acne 10/05/2013  . History of depression 10/05/2013  . Loss of weight 10/05/2013  . Routine general medical examination at a health care facility 10/05/2013  . Smoker 10/05/2013  . Marijuana use 10/05/2013    History reviewed. No pertinent surgical history.  Prior to Admission medications   Medication Sig Start Date End Date Taking? Authorizing Provider  iron polysaccharides (NU-IRON) 150 MG capsule Take 1 capsule (150 mg total) by mouth 2 (two) times daily. 10/21/13   Tower, Audrie Gallus, MD    Allergies Patient has no known allergies.  Family History  Problem Relation Age of Onset  . Alcohol abuse Maternal Uncle     Social History Social History   Tobacco Use  . Smoking status: Current Every Day Smoker    Packs/day: 0.25    Types: Cigarettes  . Smokeless tobacco: Never Used  Substance Use Topics  . Alcohol use: Yes    Comment: weekends  . Drug use: Not Currently    Types: Marijuana    Review of Systems  Constitutional: No fever/chills  Eyes: No visual changes. ENT: No sore throat. Respiratory: Denies cough Genitourinary: Negative for dysuria. Musculoskeletal: Negative for back pain.  Positive for left leg pain Skin: Negative for rash.    ____________________________________________   PHYSICAL EXAM:  VITAL SIGNS: ED Triage Vitals  Enc Vitals Group     BP 11/10/18 1657 115/67     Pulse Rate 11/10/18 1657 86     Resp 11/10/18 1657 15     Temp 11/10/18 1657 97.9 F (36.6 C)     Temp Source 11/10/18 1657 Oral     SpO2 11/10/18 1657 100 %     Weight 11/10/18 1409 145 lb (65.8 kg)     Height 11/10/18 1409 5\' 5"  (1.651 m)     Head Circumference --      Peak Flow --      Pain Score 11/10/18 1408 9     Pain Loc --      Pain Edu? --      Excl. in GC? --     Constitutional: Alert and oriented. Well appearing and in no acute distress. Eyes: Conjunctivae are normal.  Head: Atraumatic. Nose: No congestion/rhinnorhea. Mouth/Throat: Mucous membranes are moist.   Neck:  supple no lymphadenopathy noted Cardiovascular: Normal rate, regular rhythm. Respiratory: Normal respiratory effort.  No retractions,  GU: deferred Musculoskeletal: FROM all extremities, warm and well perfused, varicose veins are noted on the lateral aspect, hamstring is tender from mid  way to the insertion at the knee.  Patient does have full range of motion.  Neurovascular is intact. Neurologic:  Normal speech and language.  Skin:  Skin is warm, dry and intact. No rash noted. Psychiatric: Mood and affect are normal. Speech and behavior are normal.  ____________________________________________   LABS (all labs ordered are listed, but only abnormal results are displayed)  Labs Reviewed - No data to display ____________________________________________   ____________________________________________  RADIOLOGY  Ultrasound of the left leg is negative for DVT  ____________________________________________   PROCEDURES  Procedure(s)  performed: No  Procedures    ____________________________________________   INITIAL IMPRESSION / ASSESSMENT AND PLAN / ED COURSE  Pertinent labs & imaging results that were available during my care of the patient were reviewed by me and considered in my medical decision making (see chart for details).   Patient is 28 year old female presents emergency department left leg pain.  Left leg pain is noted at the posterior medial and slightly into the hamstring.  Ultrasound of the left leg for DVT is negative  Explained to the patient that varicose veins can cause some of this pain along with hamstring strain.  She is to wear compression pants to help with the inflammation.  Take Tylenol and ibuprofen.  Follow-up with her regular doctor if not better in 3 to 5 days.  Orthopedics if not better in 1 week.  She states she understands will comply.  She was discharged stable condition.     As part of my medical decision making, I reviewed the following data within the electronic MEDICAL RECORD NUMBER Nursing notes reviewed and incorporated, Old chart reviewed, Radiograph reviewed sound is negative for DVT, Notes from prior ED visits and [place] Controlled Substance Database  ____________________________________________   FINAL CLINICAL IMPRESSION(S) / ED DIAGNOSES  Final diagnoses:  Left leg pain  Hamstring strain, initial encounter      NEW MEDICATIONS STARTED DURING THIS VISIT:  Discharge Medication List as of 11/10/2018  4:47 PM       Note:  This document was prepared using Dragon voice recognition software and may include unintentional dictation errors.    Faythe Ghee, PA-C 11/10/18 1914    Don Perking, [place], MD 11/10/18 2040

## 2018-11-10 NOTE — Discharge Instructions (Addendum)
Follow-up with your regular doctor or Dr. Allena Katz if your leg is not better in 1 week.  She continued to have problems with your veins in your legs follow-up with your regular doctor for referral to a vascular surgeon.  Wear compression socks or pants.  Apply wet heat to the posterior part of the leg and then follow-up with ice.  Tylenol and ibuprofen for pain as needed.

## 2018-11-10 NOTE — ED Triage Notes (Signed)
Pt in via POV with complaints of left leg pain since Sunday, denies any recent injury.  NAD noted at this time.

## 2019-05-29 ENCOUNTER — Other Ambulatory Visit: Payer: Self-pay

## 2019-05-29 ENCOUNTER — Encounter: Payer: Self-pay | Admitting: Emergency Medicine

## 2019-05-29 ENCOUNTER — Emergency Department
Admission: EM | Admit: 2019-05-29 | Discharge: 2019-05-29 | Disposition: A | Discharge status: Home / Self Care | Payer: Self-pay | Attending: Student | Admitting: Student

## 2019-05-29 DIAGNOSIS — F17210 Nicotine dependence, cigarettes, uncomplicated: Secondary | ICD-10-CM | POA: Insufficient documentation

## 2019-05-29 DIAGNOSIS — R0981 Nasal congestion: Secondary | ICD-10-CM | POA: Insufficient documentation

## 2019-05-29 DIAGNOSIS — Z20822 Contact with and (suspected) exposure to covid-19: Secondary | ICD-10-CM

## 2019-05-29 DIAGNOSIS — Z79899 Other long term (current) drug therapy: Secondary | ICD-10-CM | POA: Insufficient documentation

## 2019-05-29 DIAGNOSIS — Z20828 Contact with and (suspected) exposure to other viral communicable diseases: Secondary | ICD-10-CM | POA: Insufficient documentation

## 2019-05-29 LAB — SARS CORONAVIRUS 2 (TAT 6-24 HRS): SARS Coronavirus 2: NEGATIVE

## 2019-05-29 MED ORDER — FLUTICASONE PROPIONATE 50 MCG/ACT NA SUSP: 2.0000 | Freq: Every day | NASAL | 0 refills | Status: DC

## 2019-05-29 MED ORDER — BENZONATATE 100 MG PO CAPS: ORAL_CAPSULE | ORAL | 0 refills | Status: DC

## 2019-05-29 MED ORDER — CETIRIZINE-PSEUDOEPHEDRINE ER 5-120 MG PO TB12: 1.0000 | ORAL_TABLET | Freq: Two times a day (BID) | ORAL | 0 refills | Status: AC

## 2019-05-29 NOTE — Discharge Instructions (Addendum)
Use the prescription meds as directed. You will be notified of POSITIVE results only, via phone. Continue to remain at home under quarantine until symptoms improve and results are verified. Return as needed.

## 2019-05-29 NOTE — ED Notes (Signed)
Pt c/o body aches, congestion, HA, sore throat. Denies N/V/D/fever. Breathing regular and unlabored. Pt alert/calmly laying in bed.

## 2019-05-29 NOTE — ED Provider Notes (Signed)
[facility] Emergency Department Provider Note ____________________________________________  Time seen: 1245  I have reviewed the triage vital signs and the nursing notes.  HISTORY  Chief Complaint  Cough, Nasal Congestion, and Covid Testing  HPI Patricia Allison is a 28 y.o. female presents with self to the ED for evaluation of a sudden onset of cough as well as  complaints of runny nose and body aches for the past several days.  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 She denies any interim fevers, chills, or sweats. She has dosed Tylenol and DayQuil. She does have some concern for possible COVID exposure.  Past Medical History:  Diagnosis Date  . Depression   . Eating disorder   . Fainting spell     Patient Active Problem List   Diagnosis Date Noted  . Substance induced mood disorder ([place]) 01/19/2018  . Alcohol abuse 01/19/2018  . Benzodiazepine abuse ([place]) 01/19/2018  . Goiter 10/17/2013  . Anemia 10/05/2013  . ADHD (attention deficit hyperactivity disorder) 10/05/2013  . Acne 10/05/2013  . History of depression 10/05/2013  . Loss of weight 10/05/2013  . Routine general medical examination at a health care facility 10/05/2013  . Smoker 10/05/2013  . Marijuana use 10/05/2013    History reviewed. No pertinent surgical history.  Prior to Admission medications   Medication Sig Start Date End Date Taking? Authorizing Provider  benzonatate (TESSALON PERLES) 100 MG capsule Take 1-2 tabs TID prn cough 05/29/19   Nicci Vaughan, Dannielle Karvonen, PA-C  cetirizine-pseudoephedrine (ZYRTEC-D ALLERGY & CONGESTION) 5-120 MG tablet Take 1 tablet by mouth 2 (two) times daily for 15 days. 05/29/19 06/13/19  Jermine Bibbee, Dannielle Karvonen, PA-C  fluticasone (FLONASE) 50 MCG/ACT nasal spray Place 2 sprays into both nostrils daily. 05/29/19   Symiah Nowotny, Dannielle Karvonen, PA-C  iron polysaccharides (NU-IRON) 150 MG capsule Take 1 capsule (150 mg  total) by mouth 2 (two) times daily. 10/21/13   Tower, Wynelle Fanny, MD    Allergies Patient has no known allergies.  Family History  Problem Relation Age of Onset  . Alcohol abuse Maternal Uncle     Social History Social History   Tobacco Use  . Smoking status: Current Every Day Smoker    Packs/day: 0.25    Types: Cigarettes  . Smokeless tobacco: Never Used  Substance Use Topics  . Alcohol use: Yes    Comment: weekends  . Drug use: Not Currently    Types: Marijuana    Review of Systems  Constitutional: Negative for fever. Eyes: Negative for visual changes. ENT: Negative for sore throat.  Reports runny nose. Cardiovascular: Negative for chest pain. Respiratory: Negative for shortness of breath.  Reports cough as above. Gastrointestinal: Negative for abdominal pain, vomiting and diarrhea. Genitourinary: Negative for dysuria. Musculoskeletal: Negative for back pain.  Reports generalized body aches. Skin: Negative for rash. Neurological: Negative for headaches, focal weakness or numbness. ____________________________________________  PHYSICAL EXAM:  VITAL SIGNS: ED Triage Vitals  Enc Vitals Group     BP 05/29/19 1151 (!) 144/90     Pulse Rate 05/29/19 1151 73     Resp 05/29/19 1151 16     Temp 05/29/19 1151 98.8 F (37.1 C)     Temp Source 05/29/19 1151 Oral     SpO2 05/29/19 1151 99 %     Weight 05/29/19 1156 120 lb (54.4 kg)     Height 05/29/19 1156 5\' 5"  (1.651 m)  Head Circumference --      Peak Flow --      Pain Score 05/29/19 1156 8     Pain Loc --      Pain Edu? --      Excl. in GC? --     Constitutional: Alert and oriented. Well appearing and in no distress. Head: Normocephalic and atraumatic. Eyes: Conjunctivae are normal. PERRL. Normal extraocular movements Ears: Canals clear. TMs intact bilaterally. Nose: No congestion/epistaxis. Copious clear rhinorrhea noted. Mouth/Throat: Mucous membranes are moist. Neck: Supple. No  thyromegaly. Cardiovascular: Normal rate, regular rhythm. Normal distal pulses. Respiratory: Normal respiratory effort. No wheezes/rales/rhonchi. Musculoskeletal: Nontender with normal range of motion in all extremities.  Neurologic:  Normal gait without ataxia. Normal speech and language. No gross focal neurologic deficits are appreciated. ____________________________________________   LABS (pertinent positives/negatives) Labs Reviewed  SARS CORONAVIRUS 2 (TAT 6-24 HRS)  ____________________________________________  PROCEDURES  Procedures ____________________________________________  INITIAL IMPRESSION / ASSESSMENT AND PLAN / ED COURSE  Patient evaluated, tested and sent home with instructions for home care and Quarantine. Instructed to seek further care if symptoms worsen.  She will be notified via telephone of any positive results.  She will otherwise remain under quarantine until symptoms improve or results are available.  She will take prescription nasal spray, allergy medicine, and nasal spray as directed.  Patricia Allison was evaluated in Emergency Department on 05/29/2019 for the symptoms described in the history of present illness. She was evaluated in the context of the global COVID-19 pandemic, which necessitated consideration that the patient might be at risk for infection with the SARS-CoV-2 virus that causes COVID-19. Institutional protocols and algorithms that pertain to the evaluation of patients at risk for COVID-19 are in a state of rapid change based on information released by regulatory bodies including the CDC and federal and state organizations. These policies and algorithms were followed during the patient's care in the ED. ____________________________________________  FINAL CLINICAL IMPRESSION(S) / ED DIAGNOSES  Final diagnoses:  Nasal congestion  Suspected Covid-19 Virus Infection      Karmen Stabs, Charlesetta Ivory, PA-C 05/29/19 1317    Miguel Aschoff.,  MD 05/29/19 2002

## 2019-05-29 NOTE — ED Triage Notes (Signed)
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

## 2019-11-18 IMAGING — CR DG CHEST 2V
2 series · 2 of 2 positions shown · non-contrast
Comparison: None.

CLINICAL DATA: Chest pain

EXAM:
CHEST - 2 VIEW

[chest pa]
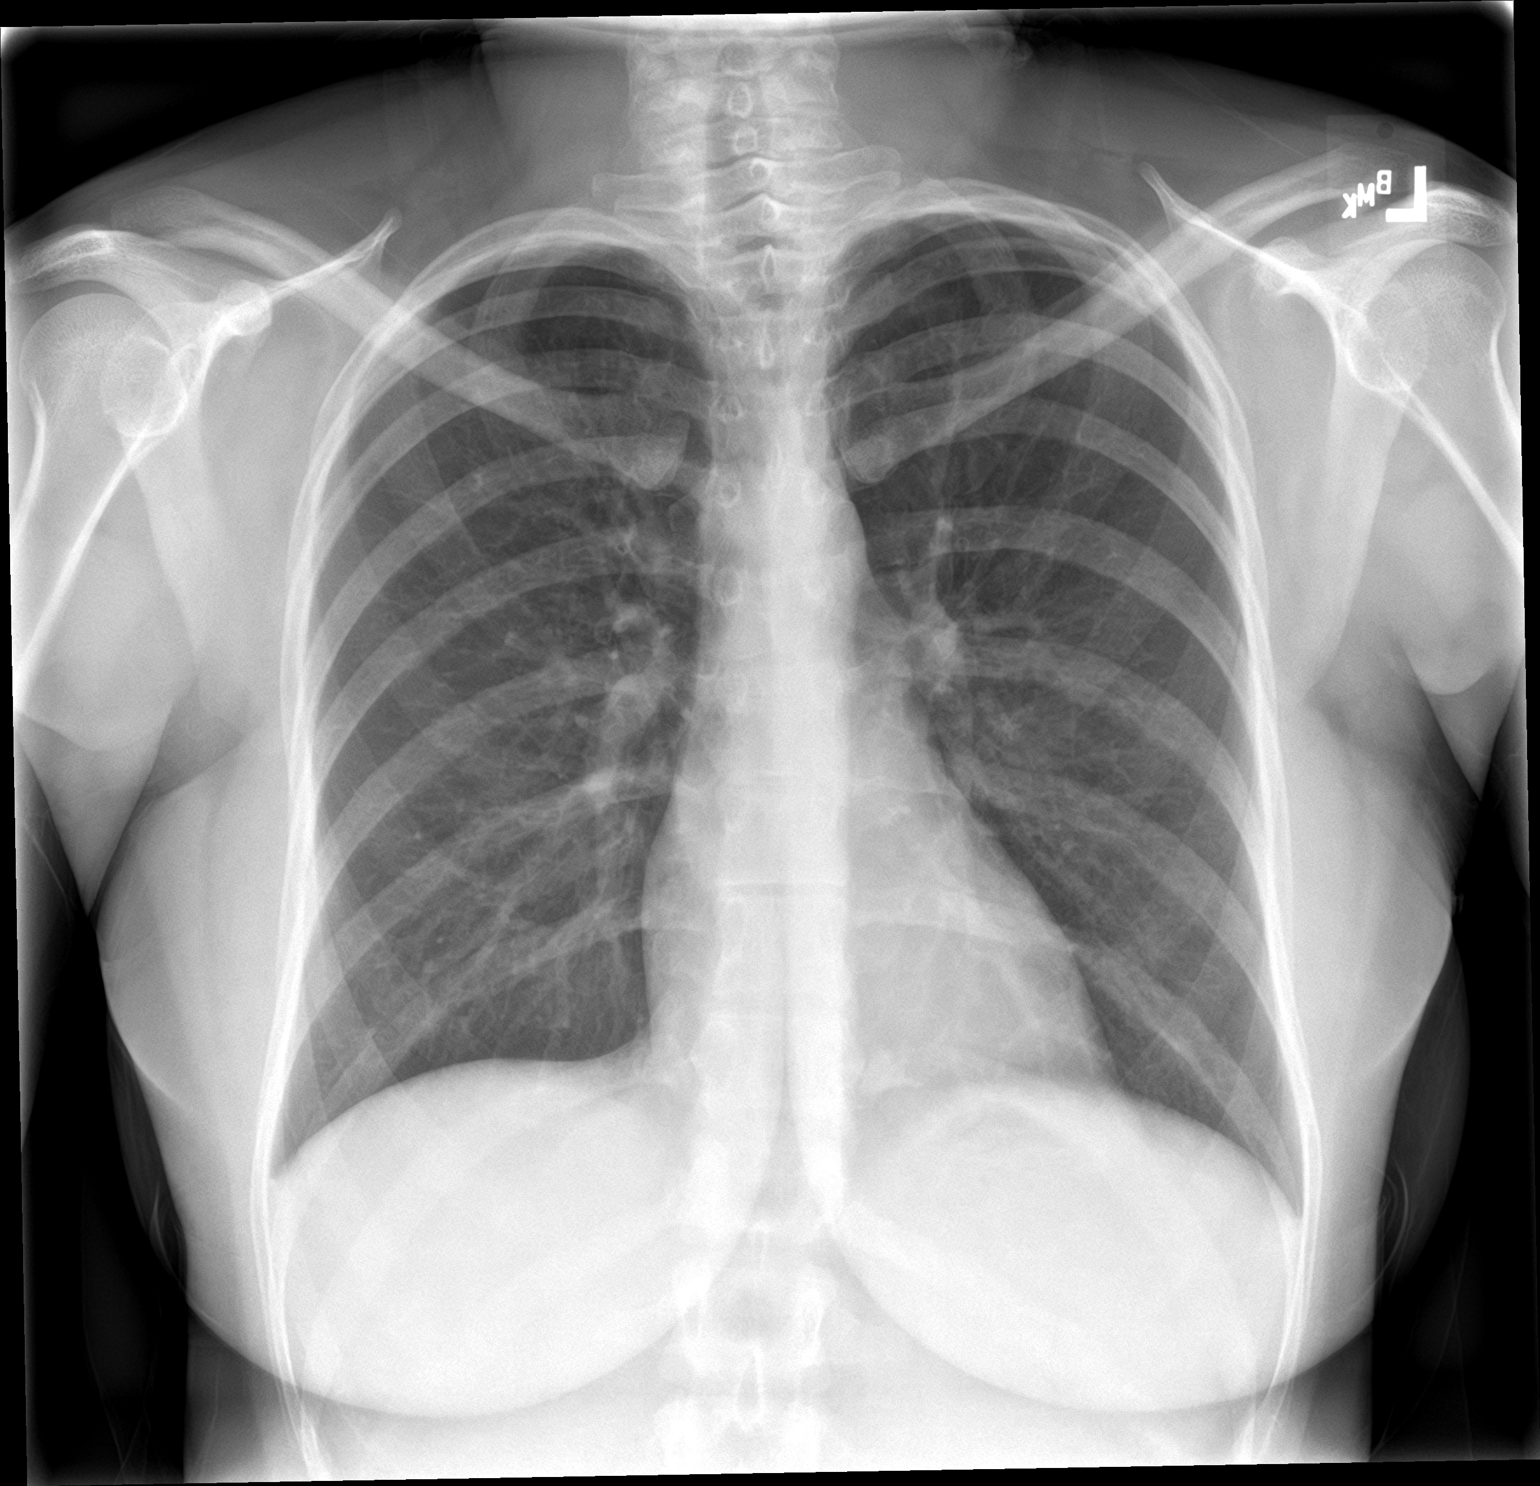

[chest lat]
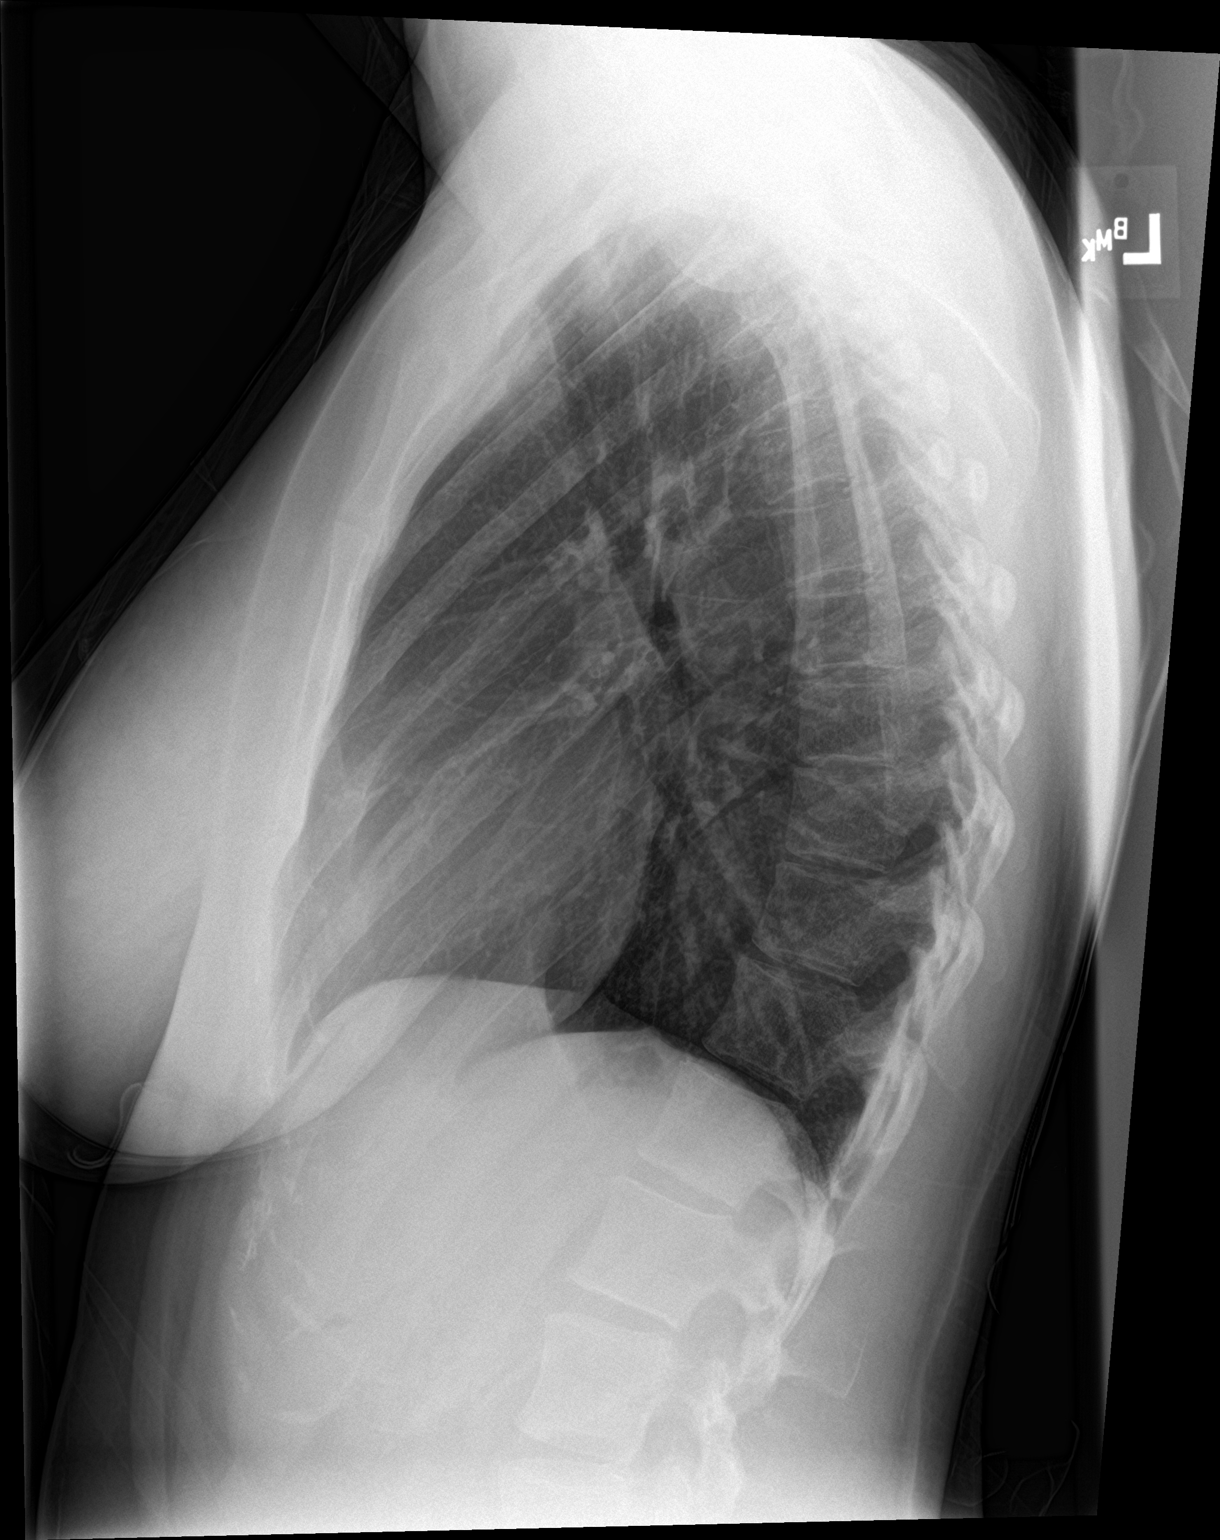

[2 of 2 positions shown; findings below may reference images not displayed]

FINDINGS: The heart size and mediastinal contours are within normal limits.
Both lungs are clear. The visualized skeletal structures are
unremarkable.
IMPRESSION: No active cardiopulmonary disease.

## 2020-03-21 IMAGING — US VENOUS DOPPLER ULTRASOUND OF LEFT LOWER EXTREMITY
1 series · 13 of 24 positions shown · non-contrast
Comparison: None.

CLINICAL DATA: 27-year-old female with left lower extremity pain
and swelling for the past 2 days



[Series 1: venous doppler ultrasound of left lower extremity · 13 of 33 slices shown]
[im 1/33]
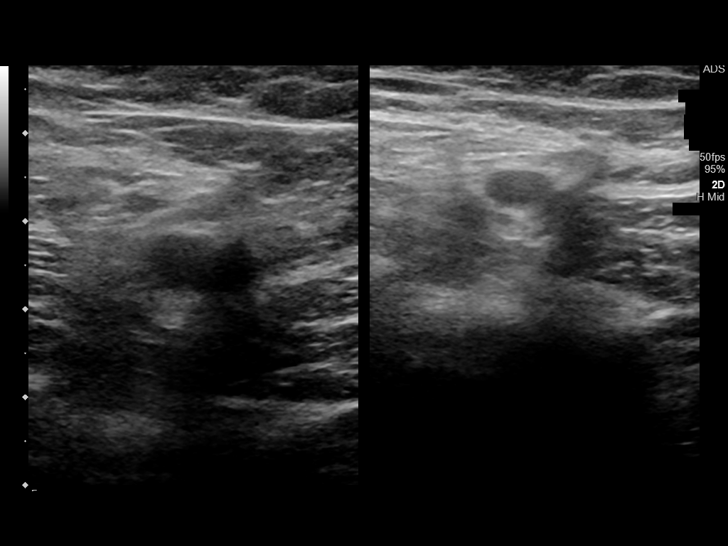
[im 3/33]
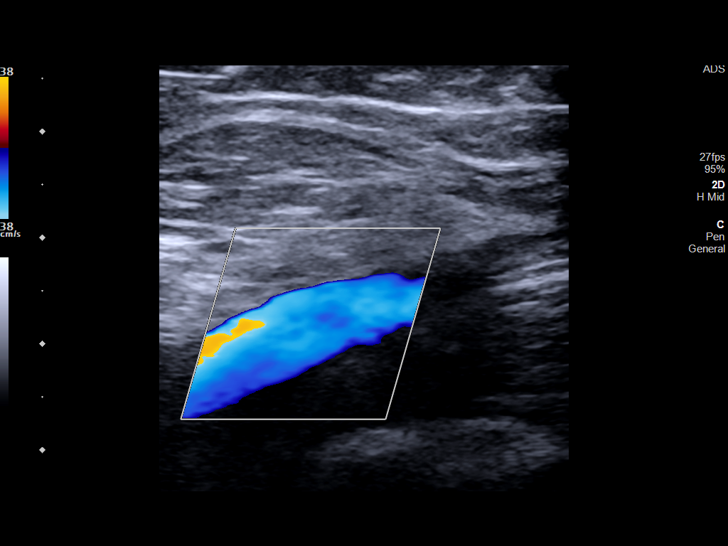
[im 6/33]
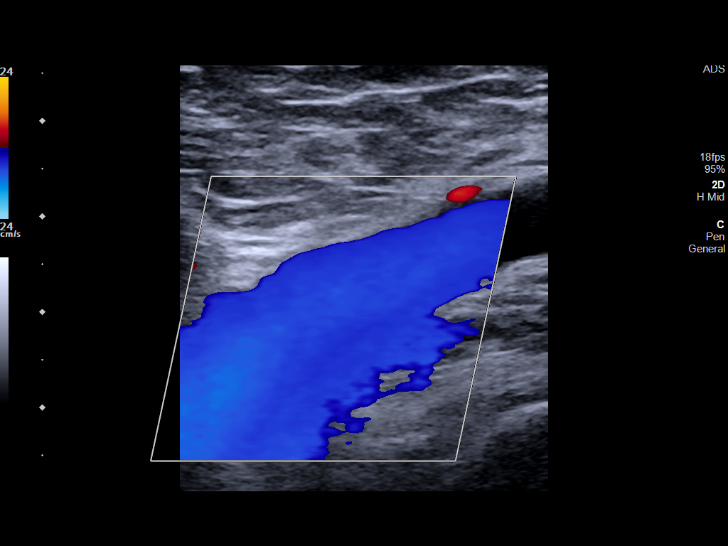
[im 9/33]
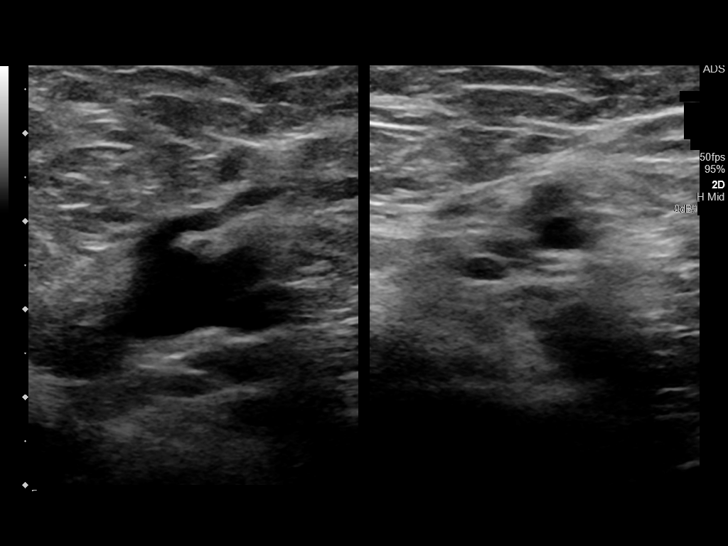
[im 12/33]
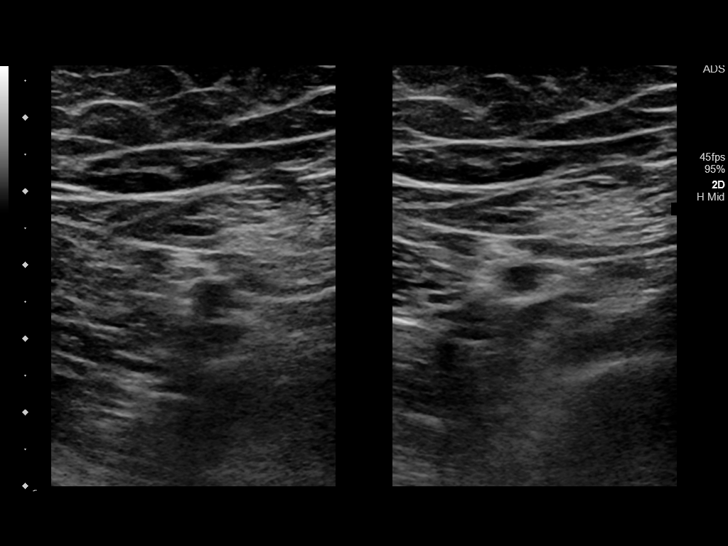
[im 14/33]
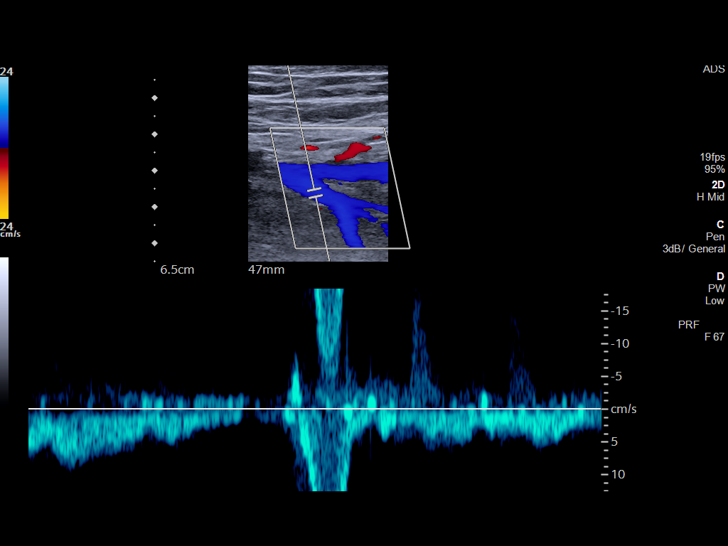
[im 17/33]
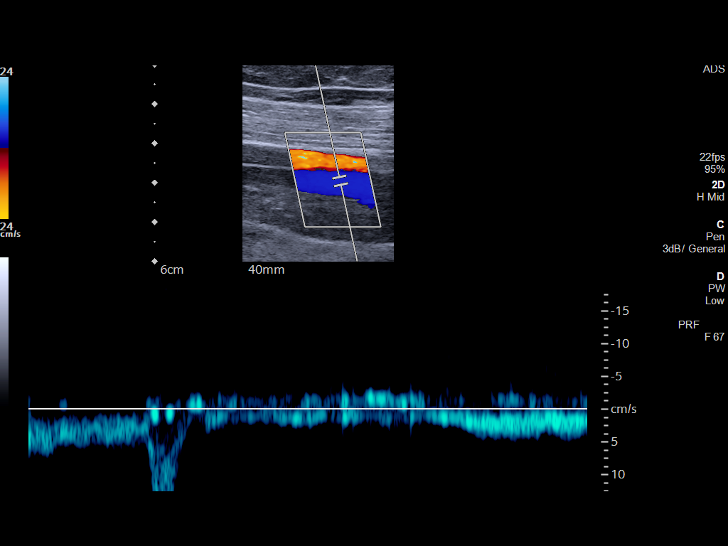
[im 19/33]
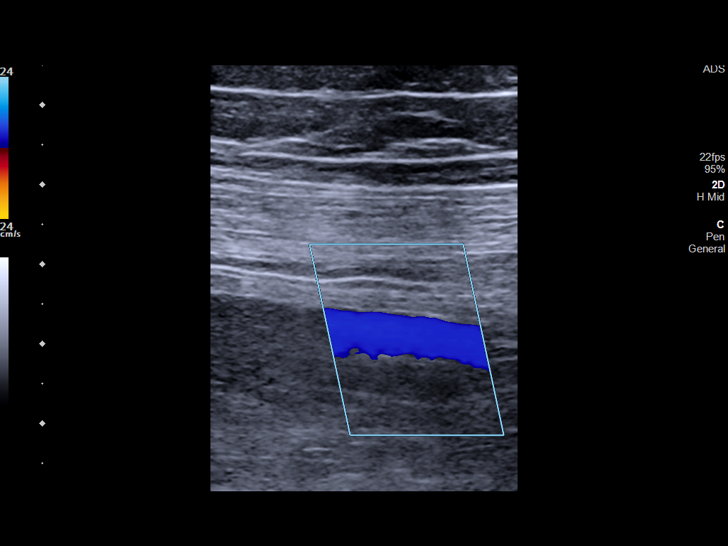
[im 21/33]
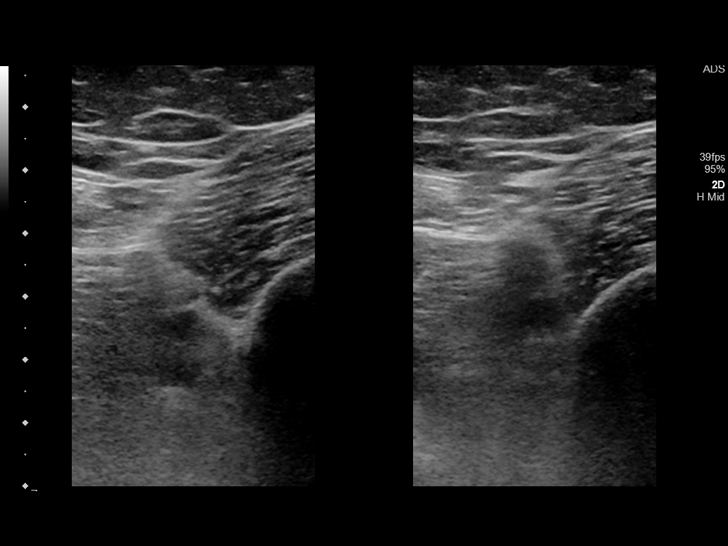
[im 24/33]
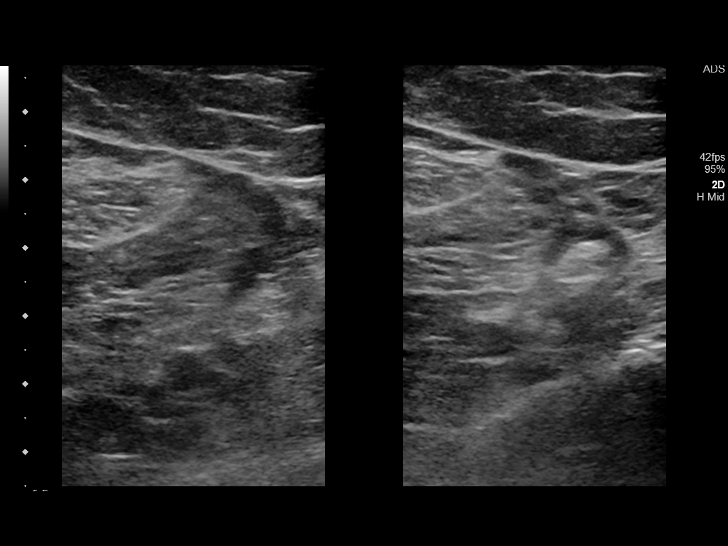
[im 27/33]
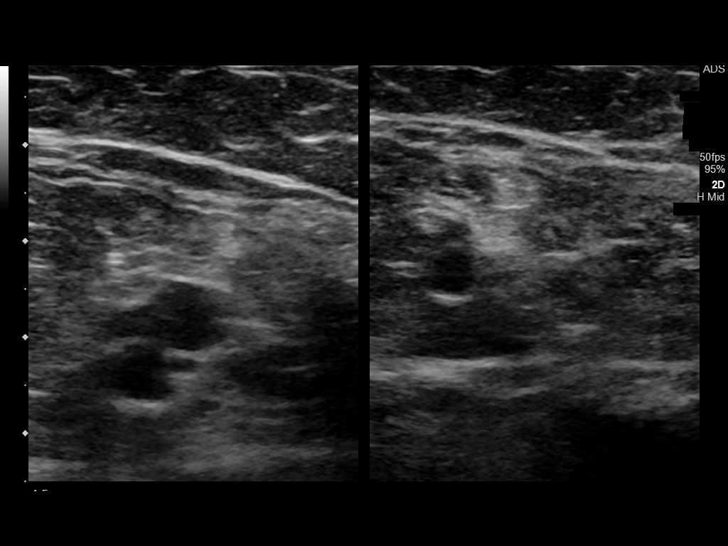
[im 30/33]
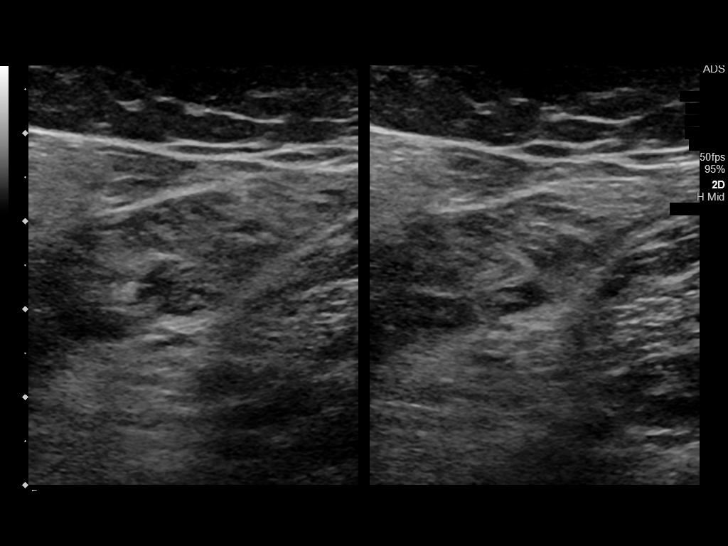
[im 33/33]
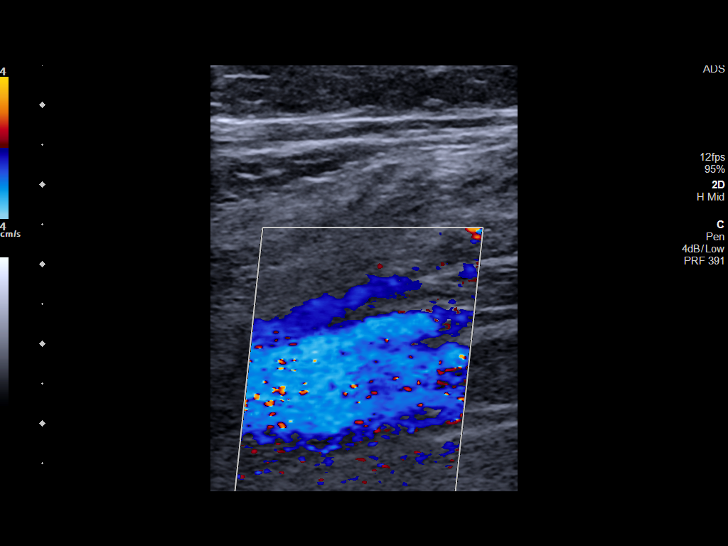

[13 of 24 positions shown; findings below may reference images not displayed]

FINDINGS: Contralateral Common Femoral Vein: Respiratory phasicity is normal
and symmetric with the symptomatic side. No evidence of thrombus.
Normal compressibility.

Common Femoral Vein: No evidence of thrombus. Normal
compressibility, respiratory phasicity and response to augmentation.

Saphenofemoral Junction: No evidence of thrombus. Normal
compressibility and flow on color Doppler imaging.

Profunda Femoral Vein: No evidence of thrombus. Normal
compressibility and flow on color Doppler imaging.

Femoral Vein: No evidence of thrombus. Normal compressibility,
respiratory phasicity and response to augmentation.

Popliteal Vein: No evidence of thrombus. Normal compressibility,
respiratory phasicity and response to augmentation.

Calf Veins: No evidence of thrombus. Normal compressibility and flow
on color Doppler imaging.

Superficial Great Saphenous Vein: No evidence of thrombus. Normal
compressibility.

Venous Reflux:  None.

Other Findings:  None.
IMPRESSION: No evidence of deep venous thrombosis.

## 2020-04-29 ENCOUNTER — Other Ambulatory Visit: Payer: Self-pay

## 2020-04-29 ENCOUNTER — Encounter: Payer: Self-pay | Admitting: Emergency Medicine

## 2020-04-29 ENCOUNTER — Emergency Department
Admission: EM | Admit: 2020-04-29 | Discharge: 2020-04-29 | Disposition: A | Discharge status: Home / Self Care | Payer: Self-pay | Attending: Emergency Medicine | Admitting: Emergency Medicine

## 2020-04-29 DIAGNOSIS — Y92410 Unspecified street and highway as the place of occurrence of the external cause: Secondary | ICD-10-CM | POA: Insufficient documentation

## 2020-04-29 DIAGNOSIS — Y999 Unspecified external cause status: Secondary | ICD-10-CM | POA: Insufficient documentation

## 2020-04-29 DIAGNOSIS — M791 Myalgia, unspecified site: Secondary | ICD-10-CM | POA: Insufficient documentation

## 2020-04-29 DIAGNOSIS — Y939 Activity, unspecified: Secondary | ICD-10-CM | POA: Insufficient documentation

## 2020-04-29 DIAGNOSIS — Z5321 Procedure and treatment not carried out due to patient leaving prior to being seen by health care provider: Secondary | ICD-10-CM | POA: Insufficient documentation
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
## 2021-10-01 ENCOUNTER — Encounter: Payer: Self-pay | Admitting: Obstetrics & Gynecology

## 2021-11-25 ENCOUNTER — Encounter: Payer: Self-pay | Admitting: Emergency Medicine

## 2021-11-25 ENCOUNTER — Other Ambulatory Visit: Payer: Self-pay

## 2021-11-25 ENCOUNTER — Emergency Department: Payer: BC Managed Care – PPO

## 2021-11-25 ENCOUNTER — Ambulatory Visit
Admission: EM | Admit: 2021-11-25 | Discharge: 2021-11-25 | Disposition: A | Discharge status: Other Acute Inpatient Hospital | Payer: BC Managed Care – PPO | Attending: Family Medicine | Admitting: Family Medicine

## 2021-11-25 ENCOUNTER — Emergency Department
Admission: EM | Admit: 2021-11-25 | Discharge: 2021-11-25 | Disposition: A | Discharge status: Home / Self Care | Payer: BC Managed Care – PPO | Attending: Emergency Medicine | Admitting: Emergency Medicine

## 2021-11-25 DIAGNOSIS — R102 Pelvic and perineal pain: Secondary | ICD-10-CM | POA: Diagnosis not present

## 2021-11-25 DIAGNOSIS — R112 Nausea with vomiting, unspecified: Secondary | ICD-10-CM | POA: Diagnosis not present

## 2021-11-25 DIAGNOSIS — R1032 Left lower quadrant pain: Secondary | ICD-10-CM | POA: Insufficient documentation

## 2021-11-25 DIAGNOSIS — R109 Unspecified abdominal pain: Secondary | ICD-10-CM

## 2021-11-25 LAB — POCT URINALYSIS DIP (MANUAL ENTRY)
Bilirubin, UA: NEGATIVE
Blood, UA: NEGATIVE
Glucose, UA: NEGATIVE mg/dL
Ketones, POC UA: NEGATIVE mg/dL
Leukocytes, UA: NEGATIVE
Nitrite, UA: NEGATIVE
Protein Ur, POC: NEGATIVE mg/dL
Spec Grav, UA: 1.025 (ref 1.010–1.025)
Urobilinogen, UA: 0.2 E.U./dL
pH, UA: 6 (ref 5.0–8.0)

## 2021-11-25 LAB — URINALYSIS, COMPLETE (UACMP) WITH MICROSCOPIC
Bilirubin Urine: NEGATIVE
Glucose, UA: NEGATIVE mg/dL
Hgb urine dipstick: NEGATIVE
Ketones, ur: 5 mg/dL — AB
Leukocytes,Ua: NEGATIVE
Nitrite: NEGATIVE
Protein, ur: NEGATIVE mg/dL
Specific Gravity, Urine: 1.015 (ref 1.005–1.030)
pH: 6 (ref 5.0–8.0)

## 2021-11-25 LAB — CBC
HCT: 37.9 % (ref 36.0–46.0)
Hemoglobin: 11.8 g/dL — ABNORMAL LOW (ref 12.0–15.0)
MCH: 29.9 pg (ref 26.0–34.0)
MCHC: 31.1 g/dL (ref 30.0–36.0)
MCV: 96.2 fL (ref 80.0–100.0)
Platelets: 376 10*3/uL (ref 150–400)
RBC: 3.94 MIL/uL (ref 3.87–5.11)
RDW: 14.2 % (ref 11.5–15.5)
WBC: 4.4 10*3/uL (ref 4.0–10.5)
nRBC: 0 % (ref 0.0–0.2)

## 2021-11-25 LAB — BASIC METABOLIC PANEL
Anion gap: 12 (ref 5–15)
BUN: 10 mg/dL (ref 6–20)
CO2: 25 mmol/L (ref 22–32)
Calcium: 9.6 mg/dL (ref 8.9–10.3)
Chloride: 100 mmol/L (ref 98–111)
Creatinine, Ser: 0.66 mg/dL (ref 0.44–1.00)
GFR, Estimated: >60 mL/min (ref >60)
Glucose, Bld: 99 mg/dL (ref 70–99)
Potassium: 4 mmol/L (ref 3.5–5.1)
Sodium: 137 mmol/L (ref 135–145)

## 2021-11-25 LAB — POC URINE PREG, ED: Preg Test, Ur: NEGATIVE

## 2021-11-25 LAB — POCT URINE PREGNANCY: Preg Test, Ur: NEGATIVE

## 2021-11-25 MED ORDER — OXYCODONE HCL 5 MG PO TABS
5.0000 mg | ORAL_TABLET | Freq: Once | ORAL | Status: AC
  Administered 2021-11-25: 5 mg via ORAL
  Filled 2021-11-25: qty 1

## 2021-11-25 MED ORDER — ONDANSETRON 4 MG PO TBDP: 4.0000 mg | ORAL_TABLET | Freq: Three times a day (TID) | ORAL | 0 refills | Status: DC | PRN

## 2021-11-25 MED ORDER — ACETAMINOPHEN 325 MG PO TABS
650.0000 mg | ORAL_TABLET | Freq: Once | ORAL | Status: AC
  Administered 2021-11-25: 650 mg via ORAL
  Filled 2021-11-25: qty 2

## 2021-11-25 NOTE — ED Triage Notes (Signed)
Pt sent form [facility] urgent care with c/o sudden onset left flank pain and now having abd cramping all over with N/V. Denies hx kidney stones, states she has a hx of ovarian cyst.  ?

## 2021-11-25 NOTE — ED Notes (Signed)
Pt d/c home per MD order. Discharge summary reviewed with pt, pt verbalizes understanding. Ambulatory off unit with visitor. Pt reports visitor is discharge ride home.  ?

## 2021-11-25 NOTE — ED Provider Notes (Signed)
? ?[facility] ?Provider Note ? ? ? Event Date/Time  ? First MD Initiated Contact with Patient 11/25/21 1504   ?  (approximate) ? ? ?History  ? ?Abdominal Pain and Flank Pain ? ? ?HPI ? ?Patricia Allison is a 31 y.o. female who presents to the ED for evaluation of Abdominal Pain and Flank Pain ?  ?Minimal medical hx in the chart.  ?Patient self-reports a history of left ovarian cysts.  She is currently trying to get pregnant.  LMP was last week.  G0. ? ?Patient presents to the ED for the patient a couple hours of left lower quadrant/left pelvic pain.  She reports pain starting this morning around 11 AM that has been waxing and waning, up to 9/10 intensity, down to 6 out of 10 intensity.  Always some degree of pain there.  She reports associated nausea and emesis without dysuria, diarrhea or stool changes.  Denies fever, syncope, vaginal discharge or bleeding that is new. ? ? ?Physical Exam  ? ?Triage Vital Signs: ?ED Triage Vitals  ?Enc Vitals Group  ?   BP 11/25/21 1418 138/88  ?   Pulse Rate 11/25/21 1418 69  ?   Resp 11/25/21 1418 17  ?   Temp 11/25/21 1418 98.2 ?F (36.8 ?C)  ?   Temp Source 11/25/21 1418 Oral  ?   SpO2 11/25/21 1418 100 %  ?   Weight 11/25/21 1419 165 lb (74.8 kg)  ?   Height 11/25/21 1419 5\' 5"  (1.651 m)  ?   Head Circumference --   ?   Peak Flow --   ?   Pain Score 11/25/21 1419 8  ?   Pain Loc --   ?   Pain Edu? --   ?   Excl. in [place]? --   ? ? ?Most recent vital signs: ?Vitals:  ? 11/25/21 1418 11/25/21 1739  ?BP: 138/88 97/69  ?Pulse: 69 77  ?Resp: 17 16  ?Temp: 98.2 ?F (36.8 ?C) 98.1 ?F (36.7 ?C)  ?SpO2: 100% 100%  ? ? ?General: Awake, no distress.  Appears uncomfortable.  Clutching her left side. ?CV:  Good peripheral perfusion.  ?Resp:  Normal effort.  ?Abd:  No distention.  Deep LLQ abdominal tenderness to palpation with some voluntary guarding.  Otherwise benign abdomen. ?MSK:  No deformity noted.  ?Neuro:  No focal deficits appreciated. ?Other:   ? ? ?ED Results /  Procedures / Treatments  ? ?Labs ?(all labs ordered are listed, but only abnormal results are displayed) ?Labs Reviewed  ?CBC - Abnormal; Notable for the following components:  ?    Result Value  ? Hemoglobin 11.8 (*)   ? All other components within normal limits  ?URINALYSIS, COMPLETE (UACMP) WITH MICROSCOPIC - Abnormal; Notable for the following components:  ? Color, Urine STRAW (*)   ? APPearance CLEAR (*)   ? Ketones, ur 5 (*)   ? Bacteria, UA RARE (*)   ? All other components within normal limits  ?POC URINE PREG, ED - Normal  ?BASIC METABOLIC PANEL  ? ? ?EKG ? ? ?RADIOLOGY ?Pelvic ultrasound reviewed by me with uterine fibroids and no obvious left ovarian cyst or pathology. ? ?Official radiology report(s): ?US PELVIC COMPLETE W TRANSVAGINAL AND TORSION R/O ? ?Result Date: 11/25/2021 ?CLINICAL DATA:  Left-sided pelvic pain. EXAM: TRANSABDOMINAL AND TRANSVAGINAL ULTRASOUND OF PELVIS DOPPLER ULTRASOUND OF OVARIES TECHNIQUE: Both transabdominal and transvaginal ultrasound examinations of the pelvis were performed. Transabdominal technique was performed for global  imaging of the pelvis including uterus, ovaries, adnexal regions, and pelvic cul-de-sac. It was necessary to proceed with endovaginal exam following the transabdominal exam to visualize the endometrium and right ovary. Color and duplex Doppler ultrasound was utilized to evaluate blood flow to the ovaries. COMPARISON:  None. FINDINGS: Uterus Measurements: 7.4 cm x 4.1 cm x 5.7 cm = volume: 91.4 mL. A 4.2 cm x 3.7 cm x 3.9 cm heterogeneous uterine fibroid is seen within the anterior aspect of the body of the uterus on the left. Endometrium Thickness: 13.8 mm.  No focal abnormality visualized. Right ovary Measurements: 3.9 cm x 2.2 cm x 2.1 cm = volume: 9.4 mL. A 2.1 cm x 1.3 cm x 1.3 cm anechoic structure is seen within the right ovary. No abnormal flow is seen within this region on color Doppler evaluation. Left ovary The left ovary is not visualized.  Pulsed Doppler evaluation of the RIGHT ovary demonstrates normal low-resistance arterial and venous waveforms. Other findings No abnormal free fluid. IMPRESSION: 1. Heterogeneous uterine fibroid. 2. Simple right ovarian cyst. No additional follow-up or imaging is recommended. Reference: Radiology 2019 Nov;293(2):359-371 3. Nonvisualization of the left ovary. Electronically Signed   By: Virgina Norfolk M.D.   On: 11/25/2021 17:18   ? ?PROCEDURES and INTERVENTIONS: ? ?Procedures ? ?Medications  ?acetaminophen (TYLENOL) tablet 650 mg (650 mg Oral Given 11/25/21 1508)  ?oxyCODONE (Oxy IR/ROXICODONE) immediate release tablet 5 mg (5 mg Oral Given 11/25/21 1531)  ? ? ? ?IMPRESSION / MDM / ASSESSMENT AND PLAN / ED COURSE  ?I reviewed the triage vital signs and the nursing notes. ? ?31 year old female presents to the ED with left pelvic pain, without evidence of acute pathology and ultimately suitable for outpatient management with close return precautions.  She has normal vital signs on room air.  On my initial evaluation, she appears uncomfortable and has some voluntary guarding with deeper palpations to LLQ/left pelvis, but otherwise benign abdominal examination.  No overlying skin changes or signs of trauma.  Work-up is benign with normal CBC and metabolic panel.  Urine with small ketonuria suggestive of mild dehydration.  Due to her self-reports of left ovarian cyst in the past, pelvic ultrasound performed and without clear evidence of left ovarian pathology or torsion.  Left ovary is not visualized.  Anticipate if she had a large cyst causing torsion, this would be more obvious.  On my reassessment, she has resolution of her pain, and subsequently benign and normal examination.  We discussed possible CT scanning versus outpatient management with return precautions, she would prefer the latter.  I provided prescription for Zofran and we discussed OTC medications for pain.  Return precautions for the ED discussed and  she is suitable for outpatient management. ?Less likely appendicitis, cholecystitis, colitis or ureteral stone ? ?Clinical Course as of 11/25/21 2050  ?Mon Nov 25, 2021  ?1731 Reassessed.  Feeling much better.  Now has a benign abdominal examination.  Her fianc? is at the bedside and reports that she seems okay.  We discussed reassuring work-up.  We discussed no signs of significant ovarian pathology on the left and I am reassured by her examination and resolution of symptoms at this point.  We discussed possible diagnostics further versus outpatient management with return precautions.  Shared decision making with plan to pursue outpatient management without CT imaging and we discussed appropriate return precautions for the ED. [DS]  ?  ?Clinical Course User Index ?[DS] Vladimir Crofts, MD  ? ? ? ?FINAL CLINICAL IMPRESSION(S) /  ED DIAGNOSES  ? ?Final diagnoses:  ?Left flank pain  ?Pelvic pain  ? ? ? ?Rx / DC Orders  ? ?ED Discharge Orders   ? ?      Ordered  ?  ondansetron (ZOFRAN-ODT) 4 MG disintegrating tablet  Every 8 hours PRN       ? 11/25/21 1732  ? ?  ?  ? ?  ? ? ? ?Note:  This document was prepared using Dragon voice recognition software and may include unintentional dictation errors. ?  ?Vladimir Crofts, MD ?11/25/21 2050 ? ?

## 2021-11-25 NOTE — ED Triage Notes (Signed)
Pt here with left sided flank pain since today.  ?

## 2021-11-25 NOTE — ED Notes (Signed)
See triage note  presents with sudden onset on left  sided abd pain  had some nausea and dry heaves   describes pain cramping in abd   was sent over UC   ?

## 2021-11-25 NOTE — Discharge Instructions (Signed)
Please take Tylenol and ibuprofen/Advil for your pain.  It is safe to take them together, or to alternate them every few hours.  Take up to 1000mg  of Tylenol at a time, up to 4 times per day.  Do not take more than 4000 mg of Tylenol in 24 hours.  For ibuprofen, take 400-600 mg, 4-5 times per day. ? ?You Zofran as needed for any further nausea and vomiting. ?

## 2021-11-27 NOTE — ED Provider Notes (Signed)
?[facility] ? ? ?[phone] ?11/25/21 Arrival Time: 1324 ? ?ASSESSMENT & PLAN: ? ?1. Left lower quadrant abdominal pain   ? ?Reports she is in significant pain here. U/A normal. UPT negative. Unclear etiology. Question ovarian cyst. Cannot r/o torsion. Discussed. To ED for further evaluation. Private vehicle. Stable upon d/c. ? ? Follow-up Information   ? ? Go to  [facility] EMERGENCY DEPARTMENT.   ?Specialty: Emergency Medicine ?Contact information: ?[address] ?[id number] ?[place] [address] ?[phone] ? ?  ?  ? ?  ?  ? ?  ? ? ? ?Reviewed expectations re: course of current medical issues. Questions answered. ?Outlined signs and symptoms indicating need for more acute intervention. ?Patient verbalized understanding. ?After Visit Summary given. ? ? ?SUBJECTIVE: ?History from: patient. ?Patricia Allison is a 31 y.o. female who presents with complaint of persistent lower abd pain; mostly on LEFT. Abrupt onset. This am upon waking. Afebrile. Patient's last menstrual period was 11/11/2021 (approximate). Normal PO intake with mild nausea. ?No tx PTA. ? ?Patient's last menstrual period was 11/11/2021 (approximate). ? ?History reviewed. No pertinent surgical history. ? ? ?OBJECTIVE: ? ?Vitals:  ? 11/25/21 1334  ?BP: 131/75  ?Pulse: 71  ?Resp: 18  ?Temp: 98 ?F (36.7 ?C)  ?SpO2: 98%  ?  ?General appearance: alert, oriented, no acute distress but appears uncomfortable ?HEENT: [place]; AT; oropharynx moist ?Lungs: unlabored respirations ?Abdomen: soft; without distention; poorly localized tenderness to palpation over lower abdomen ; normal bowel sounds; without masses or organomegaly; without guarding or rebound tenderness ?Back: without reported CVA tenderness; FROM at waist ?Extremities: without LE edema; symmetrical; without gross deformities ?Skin: warm and dry ?Neurologic: normal gait ?Psychological: alert and cooperative; normal mood and affect ? ?Labs: ?Results for  orders placed or performed during the hospital encounter of 11/25/21  ?POCT urinalysis dipstick  ?Result Value Ref Range  ? Color, UA yellow yellow  ? Clarity, UA clear clear  ? Glucose, UA negative negative mg/dL  ? Bilirubin, UA negative negative  ? Ketones, POC UA negative negative mg/dL  ? Spec Grav, UA 1.025 1.010 - 1.025  ? Blood, UA negative negative  ? pH, UA 6.0 5.0 - 8.0  ? Protein Ur, POC negative negative mg/dL  ? Urobilinogen, UA 0.2 0.2 or 1.0 E.U./dL  ? Nitrite, UA Negative Negative  ? Leukocytes, UA Negative Negative  ?POCT urine pregnancy  ?Result Value Ref Range  ? Preg Test, Ur Negative Negative  ? ?Labs Reviewed  ?POCT URINALYSIS DIP (MANUAL ENTRY)  ?POCT URINE PREGNANCY  ? ? ?No Known Allergies ?                                            ?Past Medical History:  ?Diagnosis Date  ? Depression   ? Eating disorder   ? Fainting spell   ? ? ?Social History  ? ?Socioeconomic History  ? Marital status: Single  ?  Spouse name: Not on file  ? Number of children: Not on file  ? Years of education: Not on file  ? Highest education level: Not on file  ?Occupational History  ? Not on file  ?Tobacco Use  ? Smoking status: Every Day  ?  Packs/day: 0.25  ?  Types: Cigarettes  ? Smokeless tobacco: Never  ?Vaping Use  ? Vaping Use: Never used  ?Substance and Sexual Activity  ?  Alcohol use: Yes  ?  Comment: occ  ? Drug use: Not Currently  ?  Types: Marijuana  ? Sexual activity: Not on file  ?Other Topics Concern  ? Not on file  ?Social History Narrative  ? Not on file  ? ?Social Determinants of Health  ? ?Financial Resource Strain: Not on file  ?Food Insecurity: Not on file  ?Transportation Needs: Not on file  ?Physical Activity: Not on file  ?Stress: Not on file  ?Social Connections: Not on file  ?Intimate Partner Violence: Not on file  ? ? ?Family History  ?Problem Relation Age of Onset  ? Alcohol abuse Maternal Uncle   ? ?  ?Mardella Layman, MD ?11/27/21 [phone] ? ?

## 2021-12-24 ENCOUNTER — Other Ambulatory Visit (HOSPITAL_COMMUNITY)
Admission: RE | Admit: 2021-12-24 | Discharge: 2021-12-24 | Disposition: A | Discharge status: Home / Self Care | Payer: BC Managed Care – PPO | Source: Ambulatory Visit | Attending: Obstetrics & Gynecology | Admitting: Obstetrics & Gynecology

## 2021-12-24 ENCOUNTER — Encounter: Payer: Self-pay | Admitting: Obstetrics & Gynecology

## 2021-12-24 ENCOUNTER — Ambulatory Visit (INDEPENDENT_AMBULATORY_CARE_PROVIDER_SITE_OTHER): Payer: BC Managed Care – PPO | Admitting: Obstetrics & Gynecology

## 2021-12-24 VITALS — BP 109/70 | HR 82 | Wt 164.0 lb

## 2021-12-24 DIAGNOSIS — Z124 Encounter for screening for malignant neoplasm of cervix: Secondary | ICD-10-CM | POA: Diagnosis not present

## 2021-12-24 DIAGNOSIS — N979 Female infertility, unspecified: Secondary | ICD-10-CM

## 2021-12-24 MED ORDER — LETROZOLE 2.5 MG PO TABS: 2.5000 mg | ORAL_TABLET | Freq: Every day | ORAL | 1 refills | Status: DC

## 2021-12-24 NOTE — Patient Instructions (Signed)
Discussed Letrozole which has been shown to be superior to Clomid in ovulation induction ?Patient agreed to try this.  Letrozole prescribed, 2.5 mg po daily on days 3 to 7 following a spontaneous menses or progestin-induced bleed. If the cycle is ovulatory but pregnancy has not occurred, the same dose should be used in the next cycle. If ovulation does not occur, the dose should be increased to 5 mg/day cycle days 3 to 7, with a maximal dose of 7.5 mg/day.   Continue ovulation predictor kits and regular intercourse around time of ovulation. ? ?

## 2021-12-24 NOTE — Progress Notes (Signed)
? ?GYNECOLOGY OFFICE VISIT NOTE ? ?History:  ? Patricia Allison is a 31 y.o. G0 here today for discussion of female infertility and to establish care.  She is in same -sex relationship. Has regular menstrual periods and has been using ovulation predictor kits which show she is ovulating every month.  She has sperm inseminated from a female they have an arrangement with around time of ovulation, in order to conceive.  This has been going on since September 2022 without success.  Had normal ultrasound in March 2023, she was seen in ED for pain and had a physiologic simple cyst.  She denies any abnormal vaginal discharge, bleeding, pelvic pain or other concerns.  ?  ?Past Medical History:  ?Diagnosis Date  ? Depression   ? Eating disorder   ? Fainting spell   ? ? ?History reviewed. No pertinent surgical history. ? ?The following portions of the patient's history were reviewed and updated as appropriate: allergies, current medications, past family history, past medical history, past social history, past surgical history and problem list.  ? ?Health Maintenance:  Normal pap over three years ago. ? ?Review of Systems:  ?Pertinent items noted in HPI and remainder of comprehensive ROS otherwise negative. ? ?Physical Exam:  ?BP 109/70   Pulse 82   Wt 164 lb (74.4 kg)   BMI 27.29 kg/m?  ?CONSTITUTIONAL: Well-developed, well-nourished female in no acute distress.  ?HEENT:  Normocephalic, atraumatic. External right and left ear normal. No scleral icterus.  ?NECK: Normal range of motion, supple, no masses noted on observation ?SKIN: No rash noted. Not diaphoretic. No erythema. No pallor. ?MUSCULOSKELETAL: Normal range of motion. No edema noted. ?NEUROLOGIC: Alert and oriented to person, place, and time. Normal muscle tone coordination. No cranial nerve deficit noted. ?PSYCHIATRIC: Normal mood and affect. Normal behavior. Normal judgment and thought content. ?CARDIOVASCULAR: Normal heart rate noted ?RESPIRATORY: Effort and  breath sounds normal, no problems with respiration noted ?ABDOMEN: No masses noted. No other overt distention noted.   ?PELVIC: Normal appearing external genitalia; normal urethral meatus; normal appearing vaginal mucosa and cervix.  No abnormal discharge noted. Pap smear obtained.  Normal uterine size, no other palpable masses, no uterine or adnexal tenderness. Performed in the presence of a chaperone ? ?Labs and Imaging ?US PELVIC COMPLETE W TRANSVAGINAL AND TORSION R/O ? ?Result Date: 11/25/2021 ?CLINICAL DATA:  Left-sided pelvic pain. EXAM: TRANSABDOMINAL AND TRANSVAGINAL ULTRASOUND OF PELVIS DOPPLER ULTRASOUND OF OVARIES TECHNIQUE: Both transabdominal and transvaginal ultrasound examinations of the pelvis were performed. Transabdominal technique was performed for global imaging of the pelvis including uterus, ovaries, adnexal regions, and pelvic cul-de-sac. It was necessary to proceed with endovaginal exam following the transabdominal exam to visualize the endometrium and right ovary. Color and duplex Doppler ultrasound was utilized to evaluate blood flow to the ovaries. COMPARISON:  None. FINDINGS: Uterus Measurements: 7.4 cm x 4.1 cm x 5.7 cm = volume: 91.4 mL. A 4.2 cm x 3.7 cm x 3.9 cm heterogeneous uterine fibroid is seen within the anterior aspect of the body of the uterus on the left. Endometrium Thickness: 13.8 mm.  No focal abnormality visualized. Right ovary Measurements: 3.9 cm x 2.2 cm x 2.1 cm = volume: 9.4 mL. A 2.1 cm x 1.3 cm x 1.3 cm anechoic structure is seen within the right ovary. No abnormal flow is seen within this region on color Doppler evaluation. Left ovary The left ovary is not visualized. Pulsed Doppler evaluation of the RIGHT ovary demonstrates normal low-resistance arterial and venous  waveforms. Other findings No abnormal free fluid. IMPRESSION: 1. Heterogeneous uterine fibroid. 2. Simple right ovarian cyst. No additional follow-up or imaging is recommended. Reference: Radiology  2019 Nov;293(2):359-371 3. Nonvisualization of the left ovary. Electronically Signed   By: Aram Candela M.D.   On: 11/25/2021 17:18      ?Assessment and Plan:  ?   ?1. Pap smear for cervical cancer screening ?- Cytology - PAP done, will follow up results and manage accordingly. ? ?2. Female infertility ?Will check labs, but likely will be negative given regular ovulatory cycles reported.  Offered outright referral to infertility specialist, she wants to defer this for now.  Recommended formal semen analysis for the female, and HSG as next steps.  ?Once HSG and semen analysis are normal, discussed Letrozole which has been shown to be superior to Clomid in ovulation induction .  Patient agreed to try this.  Letrozole prescribed, 2.5 mg po daily on days 3 to 7 following a spontaneous menses or progestin-induced bleed. If the cycle is ovulatory but pregnancy has not occurred, the same dose should be used in the next cycle. If ovulation does not occur, the dose should be increased to 5 mg/day cycle days 3 to 7, with a maximal dose of 7.5 mg/day.   Continue ovulation predictor kits and regular intercourse around time of ovulation.  If conception does not occur after six months, referral to specialist will be recommended. ?- TSH+Prl+TestT+TestF+17OHP ?- DG Hysterogram (HSG); Future ?- letrozole (FEMARA) 2.5 MG tablet; Take 1 tablet (2.5 mg total) by mouth daily. Take on days 3 to 7 following a spontaneous menses or progestin-induced bleed.  Dispense: 5 tablet; Refill: 1 ?Patient is already taking prenatal vitamins and optimizing her health, avoiding teratogens. ?Routine preventative health maintenance measures emphasized. ?Please refer to After Visit Summary for other counseling recommendations.  ? ?Return for any gynecologic concerns.   ? ?I spent 30 minutes dedicated to the care of this patient including pre-visit review of records, face to face time with the patient discussing her conditions and treatments and post  visit orders. ? ? ? ?Jaynie Collins, MD, FACOG ?Obstetrician Heritage manager, Faculty Practice ?Center for [employer], [facility] ? ? ? ? ? ? ?

## 2021-12-29 LAB — TSH+PRL+TESTT+TESTF+17OHP
17-Hydroxyprogesterone: 125 ng/dL
Prolactin: 5.8 ng/mL (ref 4.8–23.3)
TSH: 0.491 u[IU]/mL (ref 0.450–4.500)
Testosterone, Free: 1.1 pg/mL (ref 0.0–4.2)
Testosterone, Total, LC/MS: 40.5 ng/dL (ref 10.0–55.0)

## 2021-12-30 LAB — CYTOLOGY - PAP
Comment: NEGATIVE
Diagnosis: NEGATIVE
Diagnosis: REACTIVE
High risk HPV: NEGATIVE

## 2022-05-19 ENCOUNTER — Other Ambulatory Visit: Payer: Self-pay

## 2022-05-19 DIAGNOSIS — N979 Female infertility, unspecified: Secondary | ICD-10-CM

## 2022-05-19 MED ORDER — LETROZOLE 2.5 MG PO TABS: 2.5000 mg | ORAL_TABLET | Freq: Every day | ORAL | 1 refills | Status: AC

## 2022-05-19 NOTE — Progress Notes (Signed)
Rx refilled per pt request.

## 2022-06-09 ENCOUNTER — Other Ambulatory Visit: Payer: Self-pay | Admitting: Obstetrics & Gynecology

## 2022-06-09 DIAGNOSIS — N979 Female infertility, unspecified: Secondary | ICD-10-CM

## 2023-08-11 ENCOUNTER — Emergency Department: Payer: 59

## 2023-08-11 ENCOUNTER — Other Ambulatory Visit: Payer: Self-pay

## 2023-08-11 ENCOUNTER — Emergency Department
Admission: EM | Admit: 2023-08-11 | Discharge: 2023-08-11 | Disposition: A | Discharge status: Home / Self Care | Payer: 59 | Attending: Emergency Medicine | Admitting: Emergency Medicine

## 2023-08-11 DIAGNOSIS — R1110 Vomiting, unspecified: Secondary | ICD-10-CM | POA: Diagnosis not present

## 2023-08-11 DIAGNOSIS — D259 Leiomyoma of uterus, unspecified: Secondary | ICD-10-CM | POA: Insufficient documentation

## 2023-08-11 DIAGNOSIS — R1032 Left lower quadrant pain: Secondary | ICD-10-CM

## 2023-08-11 LAB — CBC
HCT: 29.7 % — ABNORMAL LOW (ref 36.0–46.0)
Hemoglobin: 9.2 g/dL — ABNORMAL LOW (ref 12.0–15.0)
MCH: 26.8 pg (ref 26.0–34.0)
MCHC: 31 g/dL (ref 30.0–36.0)
MCV: 86.6 fL (ref 80.0–100.0)
Platelets: 353 10*3/uL (ref 150–400)
RBC: 3.43 MIL/uL — ABNORMAL LOW (ref 3.87–5.11)
RDW: 18.6 % — ABNORMAL HIGH (ref 11.5–15.5)
WBC: 4.3 10*3/uL (ref 4.0–10.5)
nRBC: 0 % (ref 0.0–0.2)

## 2023-08-11 LAB — URINALYSIS, ROUTINE W REFLEX MICROSCOPIC
Bacteria, UA: NONE SEEN
Bilirubin Urine: NEGATIVE
Glucose, UA: NEGATIVE mg/dL
Ketones, ur: NEGATIVE mg/dL
Leukocytes,Ua: NEGATIVE
Nitrite: NEGATIVE
Protein, ur: NEGATIVE mg/dL
Specific Gravity, Urine: 1.023 (ref 1.005–1.030)
pH: 5 (ref 5.0–8.0)

## 2023-08-11 LAB — COMPREHENSIVE METABOLIC PANEL
ALT: 17 U/L (ref 0–44)
AST: 24 U/L (ref 15–41)
Albumin: 4 g/dL (ref 3.5–5.0)
Alkaline Phosphatase: 28 U/L — ABNORMAL LOW (ref 38–126)
Anion gap: 8 (ref 5–15)
BUN: 17 mg/dL (ref 6–20)
CO2: 23 mmol/L (ref 22–32)
Calcium: 9.1 mg/dL (ref 8.9–10.3)
Chloride: 104 mmol/L (ref 98–111)
Creatinine, Ser: 0.7 mg/dL (ref 0.44–1.00)
GFR, Estimated: >60 mL/min (ref >60)
Glucose, Bld: 104 mg/dL — ABNORMAL HIGH (ref 70–99)
Potassium: 4.3 mmol/L (ref 3.5–5.1)
Sodium: 135 mmol/L (ref 135–145)
Total Bilirubin: 0.8 mg/dL (ref <1.2)
Total Protein: 7.2 g/dL (ref 6.5–8.1)

## 2023-08-11 LAB — LIPASE, BLOOD: Lipase: 37 U/L (ref 11–51)

## 2023-08-11 LAB — POC URINE PREG, ED: Preg Test, Ur: NEGATIVE

## 2023-08-11 MED ORDER — KETOROLAC TROMETHAMINE 30 MG/ML IJ SOLN
15.0000 mg | Freq: Once | INTRAMUSCULAR | Status: AC
  Administered 2023-08-11: 15 mg via INTRAVENOUS
  Filled 2023-08-11: qty 1

## 2023-08-11 MED ORDER — IOHEXOL 300 MG/ML  SOLN
100.0000 mL | Freq: Once | INTRAMUSCULAR | Status: AC | PRN
  Administered 2023-08-11: 100 mL via INTRAVENOUS

## 2023-08-11 NOTE — ED Provider Notes (Signed)
[facility] Provider Note    Event Date/Time   First MD Initiated Contact with Patient 08/11/23 [phone]     (approximate)   History   Abdominal Pain   HPI  Patricia Allison is a 32 year old female presenting to the Emergency Department for evaluation of abdominal pain.  Last night, patient had onset of upper abdominal pain that then migrated to her left lower quadrant.  1 episode of vomiting.  No ongoing nausea.  No diarrhea.  Has a history of a ruptured ovarian cyst that she says felt somewhat similar.  No fevers or chills.  Does report the pain has improved at the time of my initial evaluation.    Physical Exam   Triage Vital Signs: ED Triage Vitals  Encounter Vitals Group     BP 08/11/23 0630 113/78     Systolic BP Percentile --      Diastolic BP Percentile --      Pulse Rate 08/11/23 0630 83     Resp 08/11/23 0630 18     Temp 08/11/23 0630 97.9 F (36.6 C)     Temp Source 08/11/23 0630 Oral     SpO2 08/11/23 0630 100 %     Weight 08/11/23 0628 135 lb (61.2 kg)     Height 08/11/23 0628 5\' 5"  (1.651 m)     Head Circumference --      Peak Flow --      Pain Score 08/11/23 0628 8     Pain Loc --      Pain Education --      Exclude from Growth Chart --     Most recent vital signs: Vitals:   08/11/23 0630  BP: 113/78  Pulse: 83  Resp: 18  Temp: 97.9 F (36.6 C)  SpO2: 100%     General: Awake, interactive  CV:  Regular rate, good peripheral perfusion.  Resp:  Unlabored respirations.  Abd:  Nondistended, soft, localized tenderness to palpation in the left lower quadrant without rebound or guarding Neuro:  Symmetric facial movement, fluid speech   ED Results / Procedures / Treatments   Labs (all labs ordered are listed, but only abnormal results are displayed) Labs Reviewed  COMPREHENSIVE METABOLIC PANEL - Abnormal; Notable for the following components:      Result Value   Glucose, Bld 104 (*)    Alkaline Phosphatase 28 (*)    All  other components within normal limits  CBC - Abnormal; Notable for the following components:   RBC 3.43 (*)    Hemoglobin 9.2 (*)    HCT 29.7 (*)    RDW 18.6 (*)    All other components within normal limits  URINALYSIS, ROUTINE W REFLEX MICROSCOPIC - Abnormal; Notable for the following components:   Color, Urine YELLOW (*)    APPearance CLEAR (*)    Hgb urine dipstick SMALL (*)    All other components within normal limits  LIPASE, BLOOD  POC URINE PREG, ED     EKG EKG independently reviewed interpreted by myself (ER attending) demonstrates:    RADIOLOGY Imaging independently reviewed and interpreted by myself demonstrates:  CT without bowel obstruction, does note multiple fibroids including large left-sided fibroid possibly contributing to patient's pain  PROCEDURES:  Critical Care performed: No  Procedures   MEDICATIONS ORDERED IN ED: Medications  ketorolac (TORADOL) 30 MG/ML injection 15 mg (15 mg Intravenous Given 08/11/23 0840)  iohexol (OMNIPAQUE) 300 MG/ML solution 100 mL (100 mLs Intravenous Contrast Given 08/11/23  [email or web address])     IMPRESSION / MDM / ASSESSMENT AND PLAN / ED COURSE  I reviewed the triage vital signs and the nursing notes.  Differential diagnosis includes, but is not limited to, diverticulitis, ovarian pathology though low suspicion for torsion given improvement in pain, UTI  Patient's presentation is most consistent with acute presentation with potential threat to life or bodily function.  32 year old female presenting to the Emergency Department for left lower quadrant pain.  Vital stable on presentation.  Labs with anemia with hemoglobin of 9.2, history anemia but down slightly from priors though patient without any acute bleeding reported.  CMP without critical derangements.  Urine without evidence of infection.  Normal lipase.  UPT negative.  CT does demonstrate multiple fibroids including large left-sided fibroid, but no acute findings.  Patient  treated symptomatically with Toradol.  On reevaluation, patient reports feeling much improved.  Discussed results of workup.  She will follow-up with OB/GYN regarding her CT findings.  Strict return precautions provided.  Patient discharged in stable condition.    FINAL CLINICAL IMPRESSION(S) / ED DIAGNOSES   Final diagnoses:  Left lower quadrant abdominal pain  Uterine leiomyoma, unspecified location     Rx / DC Orders   ED Discharge Orders     None        Note:  This document was prepared using Dragon voice recognition software and may include unintentional dictation errors.   Trinna Post, MD 08/11/23 [phone]

## 2023-08-11 NOTE — Discharge Instructions (Addendum)
You were seen in the ER today for your abdominal pain.  Your CT did show fibroids on your uterus which could be contributing to your pain.  I have included the radiology report below.  Please follow-up with your OB/GYN for further evaluation.  You can also discuss your anemia with them.  Return to the ER for new or worsening symptoms including worsening abdominal pain, uncontrolled bleeding, or any other new or concerning symptoms.   CT ABDOMEN AND PELVIS WITH CONTRAST    TECHNIQUE:  Multidetector CT imaging of the abdomen and pelvis was performed  using the standard protocol following bolus administration of  intravenous contrast.    RADIATION DOSE REDUCTION: This exam was performed according to the  departmental dose-optimization program which includes automated  exposure control, adjustment of the mA and/or kV according to  patient size and/or use of iterative reconstruction technique.    CONTRAST:  OMNIPAQUE IOHEXOL 300 MG/ML  SOLN    COMPARISON:  Pelvic ultrasound, 11/25/2021.  Chest XR, 07/09/2018.    FINDINGS:  Lower chest: No acute abnormality. Incidental pelvis small RIGHT  intramammary lymph nodes    Hepatobiliary: No focal liver abnormality. No gallstones,  gallbladder wall thickening, or biliary dilatation.    Pancreas: No pancreatic ductal dilatation or surrounding  inflammatory changes.    Spleen: Normal in size without focal abnormality.    Adrenals/Urinary Tract: Adrenal glands are unremarkable. Kidneys are  normal, without renal calculi, focal lesion, or hydronephrosis.  Bladder is unremarkable.    Stomach/Bowel: Stomach is within normal limits. Retrocecal appendix  appears normal. No evidence of bowel wall thickening, distention, or  inflammatory changes.    Vascular/Lymphatic: No significant vascular findings are present. No  enlarged abdominal or pelvic lymph nodes.    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 Adnexa are unremarkable.    Other: No abdominal wall hernia or abnormality. No abdominopelvic  ascites.    Musculoskeletal: No acute or significant osseous findings.    IMPRESSION:  1. No acute abdominopelvic findings.  2. Fibroid uterus with a prominent LEFT-sided 6 cm submucosal  fibroid.  In the absence of additional findings, this can contribute to the  patient's reported LEFT lower abdominal discomfort.

## 2023-08-11 NOTE — ED Triage Notes (Signed)
Pt to ED via POV c/o LLQ pain that started yesterday. Pain radiates to left flank. Pt has hx of ruptured cyst. Denies N/V/D.

## 2023-09-17 ENCOUNTER — Encounter: Payer: Self-pay | Admitting: Obstetrics & Gynecology

## 2023-09-17 ENCOUNTER — Ambulatory Visit: Payer: 59 | Admitting: Obstetrics & Gynecology

## 2023-09-17 VITALS — BP 109/70 | HR 86 | Wt 140.0 lb

## 2023-09-17 DIAGNOSIS — R102 Pelvic and perineal pain: Secondary | ICD-10-CM | POA: Diagnosis not present

## 2023-09-17 DIAGNOSIS — N979 Female infertility, unspecified: Secondary | ICD-10-CM

## 2023-09-17 DIAGNOSIS — D250 Submucous leiomyoma of uterus: Secondary | ICD-10-CM | POA: Diagnosis not present

## 2023-09-17 NOTE — Progress Notes (Signed)
GYNECOLOGY OFFICE VISIT NOTE  History:   Patricia Allison is a 33 y.o. G0P0000 here today for discussion about her 5 cm fibroid. Had episode of pain and went to ED on 08/11/2023, no pain since.  Also wants to continue infertility evaluation.  Last seen on 12/24/2021, had full discussion and was supposed to have a HSG and have her sperm donor do a semen analysis (she is in a same sex relationship). She denies any current abnormal vaginal discharge, bleeding, pelvic pain or other concerns.    Past Medical History:  Diagnosis Date   Depression    Eating disorder    Fainting spell     History reviewed. No pertinent surgical history.  The following portions of the patient's history were reviewed and updated as appropriate: allergies, current medications, past family history, past medical history, past social history, past surgical history and problem list.   Health Maintenance:  Normal pap and negative HRHPV on 12/24/2021.  Review of Systems:  Pertinent items noted in HPI and remainder of comprehensive ROS otherwise negative.  Physical Exam:  BP 109/70   Pulse 86   Wt 140 lb (63.5 kg)   LMP 09/02/2023 (Exact Date)   BMI 23.30 kg/m  CONSTITUTIONAL: Well-developed, well-nourished female in no acute distress.  HEENT:  Normocephalic, atraumatic. External right and left ear normal. No scleral icterus.  NECK: Normal range of motion, supple, no masses noted on observation SKIN: No rash noted. Not diaphoretic. No erythema. No pallor. MUSCULOSKELETAL: Normal range of motion. No edema noted. NEUROLOGIC: Alert and oriented to person, place, and time. Normal muscle tone coordination. No cranial nerve deficit noted. PSYCHIATRIC: Normal mood and affect. Normal behavior. Normal judgment and thought content. CARDIOVASCULAR: Normal heart rate noted RESPIRATORY: Effort and breath sounds normal, no problems with respiration noted ABDOMEN: No masses noted. No other overt distention noted.   PELVIC:  Deferred  Labs and Imaging CT ABDOMEN PELVIS W CONTRAST Result Date: 08/11/2023 CLINICAL DATA:  LLQ abdominal pain EXAM: CT ABDOMEN AND PELVIS WITH CONTRAST TECHNIQUE: Multidetector CT imaging of the abdomen and pelvis was performed using the standard protocol following bolus administration of intravenous contrast. RADIATION DOSE REDUCTION: This exam was performed according to the departmental dose-optimization program which includes automated exposure control, adjustment of the mA and/or kV according to patient size and/or use of iterative reconstruction technique. CONTRAST:  OMNIPAQUE IOHEXOL 300 MG/ML  SOLN COMPARISON:  Pelvic ultrasound, 11/25/2021.  Chest XR, 07/09/2018. FINDINGS: Lower chest: No acute abnormality. Incidental pelvis small RIGHT intramammary lymph nodes Hepatobiliary: No focal liver abnormality. No gallstones, gallbladder wall thickening, or biliary dilatation. Pancreas: No pancreatic ductal dilatation or surrounding inflammatory changes. Spleen: Normal in size without focal abnormality. Adrenals/Urinary Tract: Adrenal glands are unremarkable. Kidneys are normal, without renal calculi, focal lesion, or hydronephrosis. Bladder is unremarkable. Stomach/Bowel: Stomach is within normal limits. Retrocecal appendix appears normal. No evidence of bowel wall thickening, distention, or inflammatory changes. Vascular/Lymphatic: No significant vascular findings are present. No enlarged abdominal or pelvic lymph nodes. Reproductive: Fibroid uterus with prominent enhancing LEFT-sided submucosal leiomyoma measuring approximately 5.0 x 4.5 x 5.5 cm (volume = 65 cm^3) (AP transaxial by CC). Adnexa are unremarkable. Other: No abdominal wall hernia or abnormality. No abdominopelvic ascites. Musculoskeletal: No acute or significant osseous findings. IMPRESSION: 1. No acute abdominopelvic findings. 2. Fibroid uterus with a prominent LEFT-sided 6 cm submucosal fibroid. In the absence of additional  findings, this can contribute to the patient's reported LEFT lower abdominal discomfort. Electronically Signed  By: Roanna Banning M.D.   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  This was done for patient. - Ambulatory referral to Infertility  2. Pelvic pain 3. Fibroids, submucosal (Primary) Reviewed CT scan findings. Reviewed management options for fibroids. Discussed that this could also be managed by her Infertility specialists. No current symptoms. NSAIDs recommended for pain. - Ambulatory referral to Infertility  Return for any gynecologic concerns.    I spent 30 minutes dedicated to the care of this patient including pre-visit review of records, face to face time with the patient discussing her conditions and treatments and post visit orders.    Jaynie Collins, MD, FACOG Obstetrician & Gynecologist, [facility] for [employer], [facility]
